# Patient Record
Sex: Male | Born: 1955 | Race: White | Hispanic: No | Marital: Married | Smoking: Never smoker
Health system: Southern US, Community
[De-identification: ages and names within clinical notes are randomized; demographics above are authoritative.]

## PROBLEM LIST (undated history)

## (undated) DIAGNOSIS — J343 Hypertrophy of nasal turbinates: Secondary | ICD-10-CM

## (undated) DIAGNOSIS — Z8249 Family history of ischemic heart disease and other diseases of the circulatory system: Secondary | ICD-10-CM

## (undated) DIAGNOSIS — I4891 Unspecified atrial fibrillation: Secondary | ICD-10-CM

## (undated) DIAGNOSIS — K219 Gastro-esophageal reflux disease without esophagitis: Secondary | ICD-10-CM

## (undated) DIAGNOSIS — T7840XA Allergy, unspecified, initial encounter: Secondary | ICD-10-CM

## (undated) DIAGNOSIS — I10 Essential (primary) hypertension: Secondary | ICD-10-CM

## (undated) HISTORY — DX: Unspecified atrial fibrillation: I48.91

## (undated) HISTORY — PX: COLONOSCOPY: SHX174

## (undated) HISTORY — DX: Allergy, unspecified, initial encounter: T78.40XA

## (undated) HISTORY — PX: OTHER SURGICAL HISTORY: SHX169

---

## 1977-07-17 HISTORY — PX: CYST EXCISION: SHX5701

## 1978-07-17 HISTORY — PX: ANKLE ARTHRODESIS: SUR49

## 1998-08-27 ENCOUNTER — Ambulatory Visit (HOSPITAL_BASED_OUTPATIENT_CLINIC_OR_DEPARTMENT_OTHER): Admission: RE | Admit: 1998-08-27 | Discharge: 1998-08-27 | Payer: Self-pay | Admitting: Urology

## 2006-02-12 ENCOUNTER — Encounter: Admission: RE | Admit: 2006-02-12 | Discharge: 2006-02-12 | Payer: Self-pay | Admitting: *Deleted

## 2006-08-21 ENCOUNTER — Ambulatory Visit: Payer: Self-pay | Admitting: Internal Medicine

## 2006-09-28 ENCOUNTER — Ambulatory Visit: Payer: Self-pay | Admitting: Internal Medicine

## 2011-02-06 ENCOUNTER — Ambulatory Visit
Admission: RE | Admit: 2011-02-06 | Discharge: 2011-02-06 | Disposition: A | Discharge status: Home / Self Care | Payer: BC Managed Care – PPO | Source: Ambulatory Visit | Attending: Family Medicine | Admitting: Family Medicine

## 2011-02-06 ENCOUNTER — Other Ambulatory Visit: Payer: Self-pay | Admitting: Family Medicine

## 2011-02-06 DIAGNOSIS — R52 Pain, unspecified: Secondary | ICD-10-CM

## 2011-02-16 ENCOUNTER — Ambulatory Visit
Admission: RE | Admit: 2011-02-16 | Discharge: 2011-02-16 | Disposition: A | Discharge status: Home / Self Care | Payer: BC Managed Care – PPO | Source: Ambulatory Visit | Attending: Unknown Physician Specialty | Admitting: Unknown Physician Specialty

## 2011-02-16 ENCOUNTER — Other Ambulatory Visit: Payer: Self-pay | Admitting: Unknown Physician Specialty

## 2011-02-16 DIAGNOSIS — S93409A Sprain of unspecified ligament of unspecified ankle, initial encounter: Secondary | ICD-10-CM

## 2012-06-01 IMAGING — CR DG ANKLE COMPLETE 3+V*R*
2 series · 2 of 2 positions shown · non-contrast
Comparison: Right foot radiographs 02/06/2011.

CLINICAL DATA: Diffuse ankle pain and swelling following twisting
injury.

RIGHT ANKLE - COMPLETE 3+ VIEW

[view not recorded (1 of 2)]
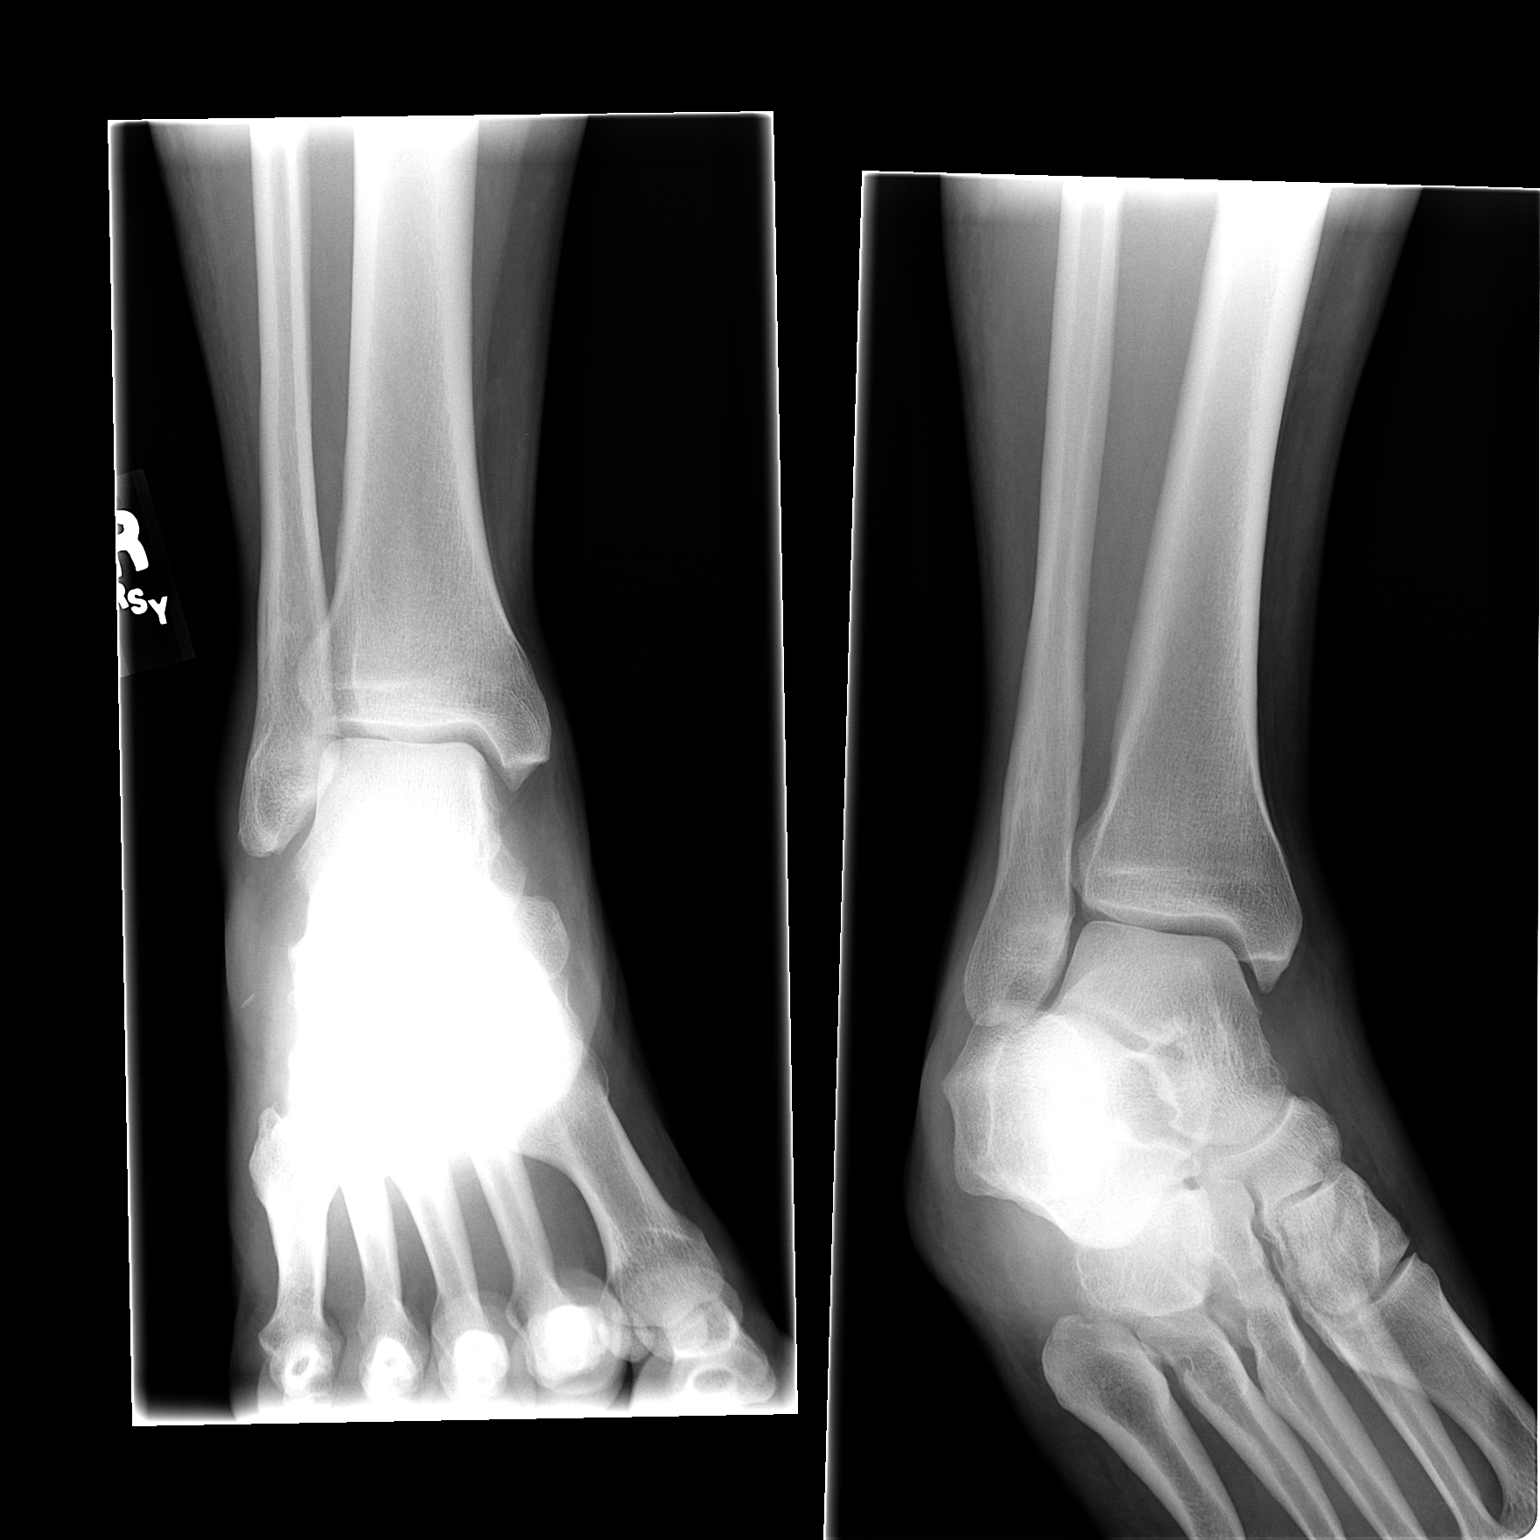

[view not recorded (2 of 2)]
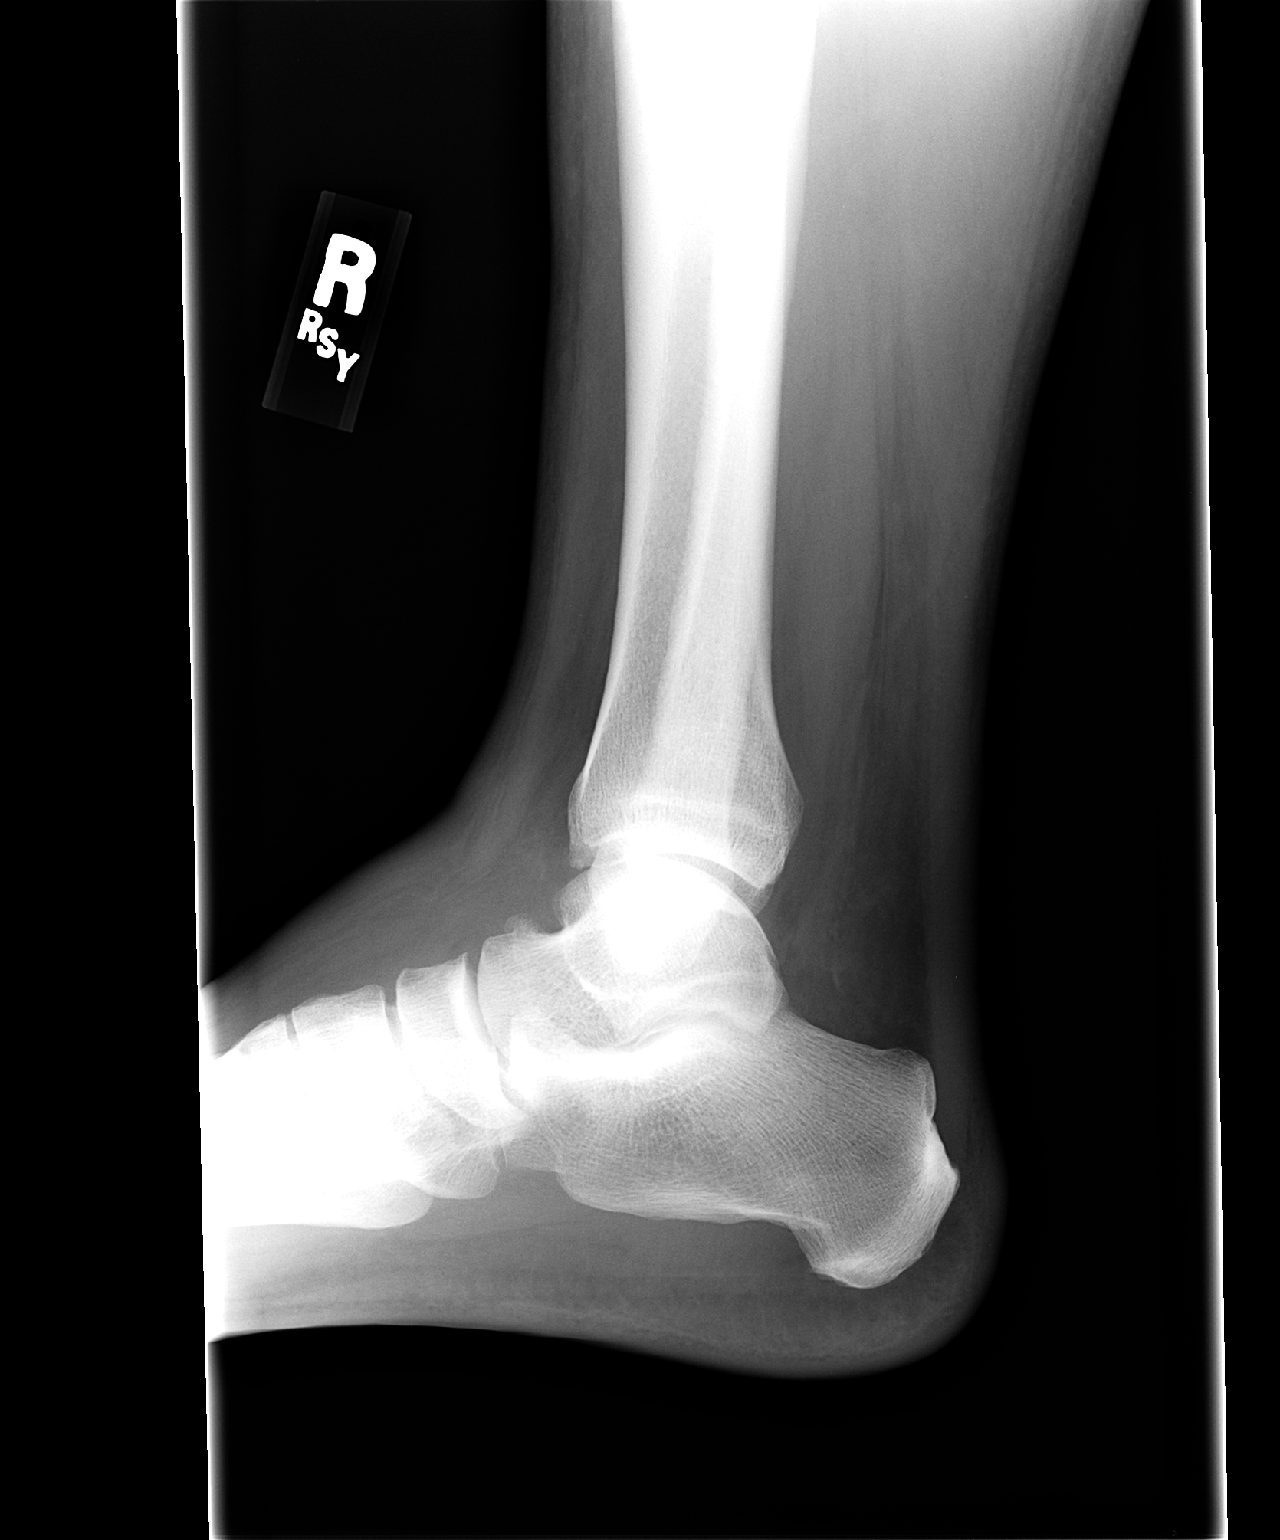

[2 of 2 positions shown; findings below may reference images not displayed]

FINDINGS: There is a small ossific density lateral to the mid foot
which could reflect a small avulsion fracture.  This is not clearly
seen on the prior examination.  There appears to be mild soft
tissue swelling in this area and extending over the dorsum of the
foot.  There is no other evidence of acute fracture or dislocation.
The alignment is normal at the ankle.
IMPRESSION: Possible small avulsion fracture laterally from the midfoot with
associated increased soft tissue swelling.

## 2013-12-30 ENCOUNTER — Other Ambulatory Visit: Payer: Self-pay | Admitting: Orthopedic Surgery

## 2013-12-31 ENCOUNTER — Encounter (HOSPITAL_BASED_OUTPATIENT_CLINIC_OR_DEPARTMENT_OTHER): Payer: Self-pay | Admitting: *Deleted

## 2013-12-31 NOTE — Progress Notes (Signed)
May r/s-no labs needed

## 2014-01-06 ENCOUNTER — Ambulatory Visit (HOSPITAL_BASED_OUTPATIENT_CLINIC_OR_DEPARTMENT_OTHER)
Admission: RE | Admit: 2014-01-06 | Payer: BC Managed Care – PPO | Source: Ambulatory Visit | Admitting: Orthopedic Surgery

## 2014-01-06 HISTORY — DX: Gastro-esophageal reflux disease without esophagitis: K21.9

## 2014-01-06 SURGERY — OPEN REDUCTION INTERNAL FIXATION (ORIF) ELBOW/OLECRANON FRACTURE
Anesthesia: General | Laterality: Left

## 2016-08-11 ENCOUNTER — Ambulatory Visit
Admission: RE | Admit: 2016-08-11 | Discharge: 2016-08-11 | Disposition: A | Discharge status: Home / Self Care | Payer: BLUE CROSS/BLUE SHIELD | Source: Ambulatory Visit | Attending: Family Medicine | Admitting: Family Medicine

## 2016-08-11 ENCOUNTER — Other Ambulatory Visit: Payer: Self-pay | Admitting: Family Medicine

## 2016-08-11 DIAGNOSIS — M25512 Pain in left shoulder: Secondary | ICD-10-CM

## 2016-08-11 DIAGNOSIS — M25511 Pain in right shoulder: Secondary | ICD-10-CM

## 2016-09-12 ENCOUNTER — Encounter: Payer: Self-pay | Admitting: Internal Medicine

## 2017-02-06 ENCOUNTER — Telehealth: Payer: Self-pay | Admitting: Internal Medicine

## 2017-02-06 NOTE — Telephone Encounter (Signed)
Left message on machine to call back  

## 2017-02-07 NOTE — Telephone Encounter (Signed)
Patient is not having any upper GI issues.  He is advised he will need an OV to discuss EGD.  He would like to proceed with colonoscopy. He will come for a pre-visit tomorrow and procedure on 02/12/17

## 2017-02-08 ENCOUNTER — Ambulatory Visit (AMBULATORY_SURGERY_CENTER): Payer: Self-pay

## 2017-02-08 VITALS — Ht 74.0 in | Wt 247.8 lb

## 2017-02-08 DIAGNOSIS — Z1211 Encounter for screening for malignant neoplasm of colon: Secondary | ICD-10-CM

## 2017-02-08 MED ORDER — NA SULFATE-K SULFATE-MG SULF 17.5-3.13-1.6 GM/177ML PO SOLN: 1.0000 | Freq: Once | ORAL | 0 refills | Status: AC

## 2017-02-08 NOTE — Progress Notes (Signed)
Denies allergies to eggs or soy products. Denies complication of anesthesia or sedation. Denies use of weight loss medication. Denies use of O2.   Emmi instructions declined.  

## 2017-02-12 ENCOUNTER — Ambulatory Visit (AMBULATORY_SURGERY_CENTER): Payer: BLUE CROSS/BLUE SHIELD | Admitting: Internal Medicine

## 2017-02-12 ENCOUNTER — Encounter: Payer: Self-pay | Admitting: Internal Medicine

## 2017-02-12 VITALS — BP 139/90 | HR 66 | Temp 96.6°F | Resp 14 | Ht 74.0 in | Wt 247.0 lb

## 2017-02-12 DIAGNOSIS — Z1212 Encounter for screening for malignant neoplasm of rectum: Secondary | ICD-10-CM | POA: Diagnosis not present

## 2017-02-12 DIAGNOSIS — D123 Benign neoplasm of transverse colon: Secondary | ICD-10-CM

## 2017-02-12 DIAGNOSIS — Z1211 Encounter for screening for malignant neoplasm of colon: Secondary | ICD-10-CM | POA: Diagnosis not present

## 2017-02-12 MED ORDER — SODIUM CHLORIDE 0.9 % IV SOLN: 500.0000 mL | INTRAVENOUS | Status: AC

## 2017-02-12 NOTE — Progress Notes (Signed)
To PACU VSS. Report to RN. Tb

## 2017-02-12 NOTE — Op Note (Signed)
[place] Patient Name: Calvin [name] Procedure Date: 02/12/2017 3:21 PM MRN: [id number] Endoscopist: Docia Chuck. Henrene Pastor , MD Age: 61 Referring MD:  Date of Birth: [birth year] Gender: Male Account #: [id number] Procedure:                Colonoscopy, with cold snare polypectomy Indications:              Screening for colorectal malignant neoplasm.                            Previous examination 2008 (to evaluate iron                            deficiency anemia) was negative for neoplasia. Medicines:                Monitored Anesthesia Care Procedure:                Pre-Anesthesia Assessment:                           - Prior to the procedure, a History and Physical                            was performed, and patient medications and                            allergies were reviewed. The patient's tolerance of                            previous anesthesia was also reviewed. The risks                            and benefits of the procedure and the sedation                            options and risks were discussed with the patient.                            All questions were answered, and informed consent                            was obtained. Prior Anticoagulants: The patient has                            taken no previous anticoagulant or antiplatelet                            agents. ASA Grade Assessment: II - A patient with                            mild systemic disease. After reviewing the risks                            and benefits, the patient was deemed in  satisfactory condition to undergo the procedure.                           After obtaining informed consent, the colonoscope                            was passed under direct vision. Throughout the                            procedure, the patient's blood pressure, pulse, and                            oxygen saturations were monitored continuously. The     Colonoscope was introduced through the anus and                            advanced to the the cecum, identified by                            appendiceal orifice and ileocecal valve. The                            ileocecal valve, appendiceal orifice, and rectum                            were photographed. The quality of the bowel                            preparation was excellent. The colonoscopy was                            performed without difficulty. The patient tolerated                            the procedure well. The bowel preparation used was                            SUPREP. Scope In: 3:27:55 PM Scope Out: 3:41:17 PM Scope Withdrawal Time: 0 hours 11 minutes 19 seconds  Total Procedure Duration: 0 hours 13 minutes 22 seconds  Findings:                 A 4 mm polyp was found in the transverse colon. The                            polyp was removed with a cold snare. Resection and                            retrieval were complete.                           Multiple diverticula were found in the left colon                            and right colon.  A diffuse area of moderate melanosis was found in                            the entire colon.                           Internal hemorrhoids were found during retroflexion.                           The exam was otherwise without abnormality on                            direct and retroflexion views. Complications:            No immediate complications. Estimated blood loss:                            None. Estimated Blood Loss:     Estimated blood loss: none. Impression:               - One 4 mm polyp in the transverse colon, removed                            with a cold snare. Resected and retrieved.                           - Diverticulosis in the left colon and in the right                            colon.                           - Melanosis in the colon.                           -  Internal hemorrhoids.                           - The examination was otherwise normal on direct                            and retroflexion views. Recommendation:           - Repeat colonoscopy in 5-10 years for                            surveillance, pending pathology.                           - Patient has a contact number available for                            emergencies. The signs and symptoms of potential                            delayed complications were discussed with the  patient. Return to normal activities tomorrow.                            Written discharge instructions were provided to the                            patient.                           - Resume previous diet.                           - Continue present medications.                           - Await pathology results. Docia Chuck. Henrene Pastor, MD 02/12/2017 3:46:07 PM This report has been signed electronically.

## 2017-02-12 NOTE — Progress Notes (Signed)
Called to room to assist during endoscopic procedure.  Patient ID and intended procedure confirmed with present staff. Received instructions for my participation in the procedure from the performing physician.  

## 2017-02-12 NOTE — Patient Instructions (Signed)
Handouts: Polyps, Diverticulosis, Hemorrhoids    YOU HAD AN ENDOSCOPIC PROCEDURE TODAY AT [facility]:   Refer to the procedure report that was given to you for any specific questions about what was found during the examination.  If the procedure report does not answer your questions, please call your gastroenterologist to clarify.  If you requested that your care partner not be given the details of your procedure findings, then the procedure report has been included in a sealed envelope for you to review at your convenience later.  YOU SHOULD EXPECT: Some feelings of bloating in the abdomen. Passage of more gas than usual.  Walking can help get rid of the air that was put into your GI tract during the procedure and reduce the bloating. If you had a lower endoscopy (such as a colonoscopy or flexible sigmoidoscopy) you may notice spotting of blood in your stool or on the toilet paper. If you underwent a bowel prep for your procedure, you may not have a normal bowel movement for a few days.  Please Note:  You might notice some irritation and congestion in your nose or some drainage.  This is from the oxygen used during your procedure.  There is no need for concern and it should clear up in a day or so.  SYMPTOMS TO REPORT IMMEDIATELY:   Following lower endoscopy (colonoscopy or flexible sigmoidoscopy):  Excessive amounts of blood in the stool  Significant tenderness or worsening of abdominal pains  Swelling of the abdomen that is new, acute  Fever of 100F or higher  For urgent or emergent issues, a gastroenterologist can be reached at any hour by calling [phone].   DIET:  We do recommend a small meal at first, but then you may proceed to your regular diet.  Drink plenty of fluids but you should avoid alcoholic beverages for 24 hours.  ACTIVITY:  You should plan to take it easy for the rest of today and you should NOT DRIVE or use heavy machinery until tomorrow (because  of the sedation medicines used during the test).    FOLLOW UP: Our staff will call the number listed on your records the next business day following your procedure to check on you and address any questions or concerns that you may have regarding the information given to you following your procedure. If we do not reach you, we will leave a message.  However, if you are feeling well and you are not experiencing any problems, there is no need to return our call.  We will assume that you have returned to your regular daily activities without incident.  If any biopsies were taken you will be contacted by phone or by letter within the next 1-3 weeks.  Please call us at [phone] if you have not heard about the biopsies in 3 weeks.    SIGNATURES/CONFIDENTIALITY: You and/or your care partner have signed paperwork which will be entered into your electronic medical record.  These signatures attest to the fact that that the information above on your After Visit Summary has been reviewed and is understood.  Full responsibility of the confidentiality of this discharge information lies with you and/or your care-partner.

## 2017-02-13 ENCOUNTER — Telehealth: Payer: Self-pay | Admitting: *Deleted

## 2017-02-13 NOTE — Telephone Encounter (Signed)
  Follow up Call-  Call back number 02/12/2017  Post procedure Call Back phone  # [phone]  Permission to leave phone message Yes  Some recent data might be hidden     Patient questions:  Do you have a fever, pain , or abdominal swelling? No. Pain Score  0 *  Have you tolerated food without any problems? Yes.    Have you been able to return to your normal activities? Yes.    Do you have any questions about your discharge instructions: Diet   No. Medications  No. Follow up visit  No.  Do you have questions or concerns about your Care? No.  Actions: * If pain score is 4 or above: No action needed, pain <4.

## 2017-02-15 ENCOUNTER — Encounter: Payer: Self-pay | Admitting: Internal Medicine

## 2019-07-30 ENCOUNTER — Other Ambulatory Visit: Payer: Self-pay | Admitting: Family Medicine

## 2019-07-30 DIAGNOSIS — R9431 Abnormal electrocardiogram [ECG] [EKG]: Secondary | ICD-10-CM

## 2019-07-30 NOTE — Progress Notes (Addendum)
Pt w/ fmhx of acute cardiovascular ischemic episodes. No sx. Last ECG 2012 nml. Awaiting repeat at pt convenience. Pt would benefit from Echo and one time treadmill stress test.  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    Linna Darner, MD Family Medicine 07/30/2019, 2:30 PM

## 2019-08-12 ENCOUNTER — Other Ambulatory Visit: Payer: Self-pay

## 2019-08-12 ENCOUNTER — Encounter (HOSPITAL_BASED_OUTPATIENT_CLINIC_OR_DEPARTMENT_OTHER): Payer: Self-pay | Admitting: Otolaryngology

## 2019-08-12 ENCOUNTER — Ambulatory Visit (HOSPITAL_COMMUNITY)
Admission: RE | Admit: 2019-08-12 | Discharge: 2019-08-12 | Disposition: A | Discharge status: Home / Self Care | Payer: BC Managed Care – PPO | Source: Ambulatory Visit | Attending: Family Medicine | Admitting: Family Medicine

## 2019-08-12 DIAGNOSIS — R9431 Abnormal electrocardiogram [ECG] [EKG]: Secondary | ICD-10-CM | POA: Diagnosis not present

## 2019-08-12 NOTE — Progress Notes (Signed)
  Echocardiogram 2D Echocardiogram has been performed.  Calvin [name] 08/12/2019, 8:40 AM

## 2019-08-13 ENCOUNTER — Encounter (HOSPITAL_BASED_OUTPATIENT_CLINIC_OR_DEPARTMENT_OTHER)
Admission: RE | Admit: 2019-08-13 | Discharge: 2019-08-13 | Disposition: A | Discharge status: Home / Self Care | Payer: BC Managed Care – PPO | Source: Ambulatory Visit | Attending: Otolaryngology | Admitting: Otolaryngology

## 2019-08-13 DIAGNOSIS — Z20822 Contact with and (suspected) exposure to covid-19: Secondary | ICD-10-CM | POA: Diagnosis not present

## 2019-08-13 DIAGNOSIS — Z01812 Encounter for preprocedural laboratory examination: Secondary | ICD-10-CM | POA: Diagnosis present

## 2019-08-13 DIAGNOSIS — Z01818 Encounter for other preprocedural examination: Secondary | ICD-10-CM | POA: Diagnosis present

## 2019-08-13 LAB — BASIC METABOLIC PANEL
Anion gap: 10 (ref 5–15)
BUN: 19 mg/dL (ref 8–23)
CO2: 28 mmol/L (ref 22–32)
Calcium: 9.3 mg/dL (ref 8.9–10.3)
Chloride: 101 mmol/L (ref 98–111)
Creatinine, Ser: 1.31 mg/dL — ABNORMAL HIGH (ref 0.61–1.24)
GFR calc Af Amer: >60 mL/min (ref >60)
GFR calc non Af Amer: 58 mL/min — ABNORMAL LOW (ref >60)
Glucose, Bld: 94 mg/dL (ref 70–99)
Potassium: 5.2 mmol/L — ABNORMAL HIGH (ref 3.5–5.1)
Sodium: 139 mmol/L (ref 135–145)

## 2019-08-13 NOTE — Progress Notes (Signed)
EKG done and printed. Please see printed EKG.

## 2019-08-14 ENCOUNTER — Other Ambulatory Visit (HOSPITAL_COMMUNITY)
Admission: RE | Admit: 2019-08-14 | Discharge: 2019-08-14 | Disposition: A | Discharge status: Home / Self Care | Payer: BC Managed Care – PPO | Source: Ambulatory Visit | Attending: Otolaryngology | Admitting: Otolaryngology

## 2019-08-14 DIAGNOSIS — J343 Hypertrophy of nasal turbinates: Secondary | ICD-10-CM | POA: Insufficient documentation

## 2019-08-14 DIAGNOSIS — Z01812 Encounter for preprocedural laboratory examination: Secondary | ICD-10-CM | POA: Diagnosis not present

## 2019-08-14 DIAGNOSIS — G4730 Sleep apnea, unspecified: Secondary | ICD-10-CM | POA: Insufficient documentation

## 2019-08-14 DIAGNOSIS — Z5309 Procedure and treatment not carried out because of other contraindication: Secondary | ICD-10-CM | POA: Insufficient documentation

## 2019-08-14 DIAGNOSIS — Z20822 Contact with and (suspected) exposure to covid-19: Secondary | ICD-10-CM | POA: Insufficient documentation

## 2019-08-14 LAB — SARS CORONAVIRUS 2 (TAT 6-24 HRS): SARS Coronavirus 2: NEGATIVE

## 2019-08-14 NOTE — H&P (Signed)
Otolaryngology Clinic Note  HPI:    Calvin [name] is a 64 y.o. male patient of Boris Sharper, MD for preop evaluation for septoplasty and SMR inferior turbinates.  I discussed the surgery in detail including risks and complications.  Questions were answered and informed consent was obtained.  PMH/Meds/All/SocHx/FamHx/ROS:   Past Medical History      Past Medical History:  Diagnosis Date  . Allergy   . Hypertension       Past Surgical History  No past surgical history on file.    No family history of bleeding disorders, wound healing problems or difficulty with anesthesia.   Social History  Social History        Socioeconomic History  . Marital status: Unknown    Spouse name: Not on file  . Number of children: Not on file  . Years of education: Not on file  . Highest education level: Not on file  Occupational History  . Not on file  Social Needs  . Financial resource strain: Not on file  . Food insecurity    Worry: Not on file    Inability: Not on file  . Transportation needs    Medical: Not on file    Non-medical: Not on file  Tobacco Use  . Smoking status: Never Smoker  . Smokeless tobacco: Never Used  Substance and Sexual Activity  . Alcohol use: Yes  . Drug use: Never  . Sexual activity: Not on file  Lifestyle  . Physical activity    Days per week: Not on file    Minutes per session: Not on file  . Stress: Not on file  Relationships  . Social Herbalist on phone: Not on file    Gets together: Not on file    Attends religious service: Not on file    Active member of club or organization: Not on file    Attends meetings of clubs or organizations: Not on file    Relationship status: Not on file  Other Topics Concern  . Not on file  Social History Narrative  . Not on file       Current Outpatient Medications:  .  hydroCHLOROthiazide (HYDRODIURIL) 25 MG tablet, Take 25 mg by mouth daily.,  Disp: , Rfl:  .  lisinopriL (PRINIVIL,ZESTRIL) 20 MG tablet, Take 20 mg by mouth daily., Disp: , Rfl:  .  omeprazole (PRILOSEC) 20 MG capsule, Take 20 mg by mouth daily., Disp: , Rfl:   A complete ROS was performed with pertinent positives/negatives noted in the HPI. The remainder of the ROS are negative.    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 Lungs: Clear to auscultation Heart: Regular rate and rhythm without murmurs Abdomen: Soft, active Extremities: Normal configuration Neurologic: Symmetric, grossly intact.       Impression & Plans:   Satisfactory preop check.  Plan: We will proceed with septoplasty and reduction of turbinates.  We will observe him overnight 23 hours given sleep apnea.  I gave him nasal hygiene instructions.  Code own.  I will remove nasal packs the morning following surgery, and then see him back here 9 days later for removal of septal splints.   Lilyan Gilford, MD  08/11/2019   

## 2019-08-15 NOTE — Progress Notes (Signed)
Called Dr [facility] office and spoke to his scheduler Angie. Chart was reviewed by Dr Suann Larry and states that pt will need to have a cardiology appt and a stress test that was recommended by his PCP but not ordered, before his surgery can be done. Angie states that she will let Dr Erik Obey aware.

## 2019-08-18 ENCOUNTER — Other Ambulatory Visit: Payer: Self-pay | Admitting: Family Medicine

## 2019-08-18 ENCOUNTER — Ambulatory Visit (HOSPITAL_BASED_OUTPATIENT_CLINIC_OR_DEPARTMENT_OTHER): Admission: RE | Admit: 2019-08-18 | Payer: BC Managed Care – PPO | Source: Home / Self Care | Admitting: Otolaryngology

## 2019-08-18 DIAGNOSIS — R9431 Abnormal electrocardiogram [ECG] [EKG]: Secondary | ICD-10-CM

## 2019-08-18 HISTORY — DX: Hypertrophy of nasal turbinates: J34.3

## 2019-08-18 HISTORY — DX: Essential (primary) hypertension: I10

## 2019-08-18 HISTORY — DX: Family history of ischemic heart disease and other diseases of the circulatory system: Z82.49

## 2019-08-18 SURGERY — SEPTOPLASTY, NOSE, WITH NASAL TURBINATE REDUCTION
Anesthesia: General | Laterality: Bilateral

## 2019-08-18 NOTE — Progress Notes (Signed)
Orders changed to facilitate testing for surgery clearance.  Linna Darner, MD Family Medicine 08/18/2019, 1:45 PM

## 2019-08-19 ENCOUNTER — Telehealth (HOSPITAL_COMMUNITY): Payer: Self-pay

## 2019-08-19 NOTE — Telephone Encounter (Signed)
Encounter complete. 

## 2019-08-20 ENCOUNTER — Ambulatory Visit (HOSPITAL_COMMUNITY)
Admission: RE | Admit: 2019-08-20 | Discharge: 2019-08-20 | Disposition: A | Discharge status: Home / Self Care | Payer: BC Managed Care – PPO | Source: Ambulatory Visit | Attending: Cardiology | Admitting: Cardiology

## 2019-08-20 ENCOUNTER — Other Ambulatory Visit: Payer: Self-pay

## 2019-08-20 DIAGNOSIS — R9431 Abnormal electrocardiogram [ECG] [EKG]: Secondary | ICD-10-CM

## 2019-08-20 LAB — EXERCISE TOLERANCE TEST
Estimated workload: 13.2 METS
Exercise duration (min): 10 min
Exercise duration (sec): 54 s
MPHR: 158 {beats}/min
Peak HR: 169 {beats}/min
Percent HR: 106 %
RPE: 17
Rest HR: 87 {beats}/min

## 2019-08-21 ENCOUNTER — Telehealth: Payer: Self-pay | Admitting: Family Medicine

## 2019-08-21 NOTE — H&P (Signed)
Otolaryngology Clinic Note  HPI:    Calvin [name] is a 64 y.o. male patient of Boris Sharper, MD for preop evaluation for septoplasty and SMR inferior turbinates.  I discussed the surgery in detail including risks and complications.  Questions were answered and informed consent was obtained.  PMH/Meds/All/SocHx/FamHx/ROS:   Past Medical History      Past Medical History:  Diagnosis Date  . Allergy   . Hypertension       Past Surgical History  No past surgical history on file.    No family history of bleeding disorders, wound healing problems or difficulty with anesthesia.   Social History  Social History        Socioeconomic History  . Marital status: Unknown    Spouse name: Not on file  . Number of children: Not on file  . Years of education: Not on file  . Highest education level: Not on file  Occupational History  . Not on file  Social Needs  . Financial resource strain: Not on file  . Food insecurity    Worry: Not on file    Inability: Not on file  . Transportation needs    Medical: Not on file    Non-medical: Not on file  Tobacco Use  . Smoking status: Never Smoker  . Smokeless tobacco: Never Used  Substance and Sexual Activity  . Alcohol use: Yes  . Drug use: Never  . Sexual activity: Not on file  Lifestyle  . Physical activity    Days per week: Not on file    Minutes per session: Not on file  . Stress: Not on file  Relationships  . Social Herbalist on phone: Not on file    Gets together: Not on file    Attends religious service: Not on file    Active member of club or organization: Not on file    Attends meetings of clubs or organizations: Not on file    Relationship status: Not on file  Other Topics Concern  . Not on file  Social History Narrative  . Not on file       Current Outpatient Medications:  .  hydroCHLOROthiazide (HYDRODIURIL) 25 MG tablet, Take 25 mg by mouth daily.,  Disp: , Rfl:  .  lisinopriL (PRINIVIL,ZESTRIL) 20 MG tablet, Take 20 mg by mouth daily., Disp: , Rfl:  .  omeprazole (PRILOSEC) 20 MG capsule, Take 20 mg by mouth daily., Disp: , Rfl:   A complete ROS was performed with pertinent positives/negatives noted in the HPI. The remainder of the ROS are negative.    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 Lungs: Clear to auscultation Heart: Regular rate and rhythm without murmurs Abdomen: Soft, active Extremities: Normal configuration Neurologic: Symmetric, grossly intact.       Impression & Plans:   Satisfactory preop check.  Plan: We will proceed with septoplasty and reduction of turbinates.  We will observe him overnight 23 hours given sleep apnea.  I gave him nasal hygiene instructions.  Code own.  I will remove nasal packs the morning following surgery, and then see him back here 9 days later for removal of septal splints.   Lilyan Gilford, MD  08/11/2019  

## 2019-08-21 NOTE — Anesthesia Preprocedure Evaluation (Addendum)
Anesthesia Evaluation  Patient identified by MRN, date of birth, ID band Patient awake    Reviewed: Allergy & Precautions, NPO status , Patient's Chart, lab work & pertinent test results  History of Anesthesia Complications Negative for: history of anesthetic complications  Airway Mallampati: II  TM Distance: >3 FB Neck ROM: Full    Dental no notable dental hx.    Pulmonary neg pulmonary ROS,    Pulmonary exam normal        Cardiovascular hypertension, Pt. on medications Normal cardiovascular exam     Neuro/Psych negative neurological ROS  negative psych ROS   GI/Hepatic Neg liver ROS, GERD  Medicated and Controlled,  Endo/Other  negative endocrine ROS  Renal/GU negative Renal ROS  negative genitourinary   Musculoskeletal negative musculoskeletal ROS (+)   Abdominal   Peds  Hematology negative hematology ROS (+)   Anesthesia Other Findings Day of surgery medications reviewed with patient.  Reproductive/Obstetrics negative OB ROS                            Anesthesia Physical Anesthesia Plan  ASA: II  Anesthesia Plan: General   Post-op Pain Management:    Induction: Intravenous  PONV Risk Score and Plan: 3 and Treatment may vary due to age or medical condition, Ondansetron, Dexamethasone and Midazolam  Airway Management Planned: Oral ETT  Additional Equipment: None  Intra-op Plan:   Post-operative Plan: Extubation in OR  Informed Consent: I have reviewed the patients History and Physical, chart, labs and discussed the procedure including the risks, benefits and alternatives for the proposed anesthesia with the patient or authorized representative who has indicated his/her understanding and acceptance.     Dental advisory given  Plan Discussed with: CRNA  Anesthesia Plan Comments: (Exercise Stress test 08/20/19 [place] physician: Donato Heinz, MD  Excellent  exercise capacity (13.2 METS) No evidence of ischemia  ECHO 1/21 Left Ventricle: Left ventricular ejection fraction, by visual estimation, is 60 to 65%. The left ventricle has normal function. The left ventricle has no regional wall motion abnormalities. There is no left ventricular hypertrophy. Left ventricular diastolic parameters were normal. )       Anesthesia Quick Evaluation

## 2019-08-21 NOTE — Progress Notes (Signed)
Spoke with Lattie Haw at Dr. Noreene Filbert office earlier today. Informed her that anesthesiologist,  Dr. Blair Promise reviewed patient's ETT and echo results and that he is requiring cardiology interpretation of patient's ETT before he will provide approval for anesthesia . Also informed her that patient would need a new Covid test, and would need to go today to be swabbed. Lattie Haw stated patient was aware of need for new test. Provided her with the test site closing time.

## 2019-08-21 NOTE — Telephone Encounter (Signed)
Treadmill Stress test reviewed w/ pt.  Nml stress test. No evidence of ischemia.  Pt is cleared from a cardiovascular standpoint for surgery.    Linna Darner, MD Family Medicine 08/21/2019, 1:27 PM

## 2019-08-21 NOTE — Progress Notes (Signed)
Patient's ETT interpretation reviewed by Dr. Ambrose Pancoast and deemed OK for surgery. Called Lisa at Dr. Noreene Filbert office to inform them that patient is to be at [facility] for rapid covid test tomorrow morning at 8a, and to be NPO after MN tonight, to take his omeprazole with a sip of water, and use his flonase tomorrow morning. He is to arrive at [facility] at 11:00am tomorrow and wait in the car until he is contacted to come inside. Lattie Haw expressed understanding of same and is going to communicate information to patient.

## 2019-08-22 ENCOUNTER — Encounter (HOSPITAL_BASED_OUTPATIENT_CLINIC_OR_DEPARTMENT_OTHER)
Admission: RE | Disposition: A | Discharge status: Home / Self Care | Payer: Self-pay | Source: Home / Self Care | Attending: Otolaryngology

## 2019-08-22 ENCOUNTER — Encounter (HOSPITAL_BASED_OUTPATIENT_CLINIC_OR_DEPARTMENT_OTHER): Payer: Self-pay | Admitting: Otolaryngology

## 2019-08-22 ENCOUNTER — Other Ambulatory Visit (HOSPITAL_COMMUNITY): Payer: Self-pay

## 2019-08-22 ENCOUNTER — Ambulatory Visit (HOSPITAL_BASED_OUTPATIENT_CLINIC_OR_DEPARTMENT_OTHER)
Admission: RE | Admit: 2019-08-22 | Discharge: 2019-08-22 | Disposition: A | Discharge status: Home / Self Care | Payer: BC Managed Care – PPO | Attending: Otolaryngology | Admitting: Otolaryngology

## 2019-08-22 ENCOUNTER — Other Ambulatory Visit: Payer: Self-pay

## 2019-08-22 ENCOUNTER — Other Ambulatory Visit (HOSPITAL_COMMUNITY)
Admission: RE | Admit: 2019-08-22 | Discharge: 2019-08-22 | Disposition: A | Discharge status: Home / Self Care | Payer: BC Managed Care – PPO | Source: Ambulatory Visit | Attending: Otolaryngology | Admitting: Otolaryngology

## 2019-08-22 ENCOUNTER — Ambulatory Visit (HOSPITAL_BASED_OUTPATIENT_CLINIC_OR_DEPARTMENT_OTHER): Payer: BC Managed Care – PPO | Admitting: Anesthesiology

## 2019-08-22 DIAGNOSIS — G4730 Sleep apnea, unspecified: Secondary | ICD-10-CM | POA: Insufficient documentation

## 2019-08-22 DIAGNOSIS — I10 Essential (primary) hypertension: Secondary | ICD-10-CM | POA: Diagnosis not present

## 2019-08-22 DIAGNOSIS — J343 Hypertrophy of nasal turbinates: Secondary | ICD-10-CM | POA: Insufficient documentation

## 2019-08-22 DIAGNOSIS — K219 Gastro-esophageal reflux disease without esophagitis: Secondary | ICD-10-CM | POA: Insufficient documentation

## 2019-08-22 DIAGNOSIS — Z20822 Contact with and (suspected) exposure to covid-19: Secondary | ICD-10-CM | POA: Insufficient documentation

## 2019-08-22 DIAGNOSIS — J342 Deviated nasal septum: Secondary | ICD-10-CM | POA: Diagnosis present

## 2019-08-22 DIAGNOSIS — Z79899 Other long term (current) drug therapy: Secondary | ICD-10-CM | POA: Insufficient documentation

## 2019-08-22 HISTORY — PX: NASAL SEPTOPLASTY W/ TURBINOPLASTY: SHX2070

## 2019-08-22 LAB — RESPIRATORY PANEL BY RT PCR (FLU A&B, COVID)
Influenza A by PCR: NEGATIVE
Influenza B by PCR: NEGATIVE
SARS Coronavirus 2 by RT PCR: NEGATIVE

## 2019-08-22 SURGERY — SEPTOPLASTY, NOSE, WITH NASAL TURBINATE REDUCTION
Anesthesia: General | Site: Nose | Laterality: Bilateral

## 2019-08-22 MED ORDER — OXYMETAZOLINE HCL 0.05 % NA SOLN: 2.0000 | NASAL | Status: DC | PRN

## 2019-08-22 MED ORDER — BACITRACIN ZINC 500 UNIT/GM EX OINT
TOPICAL_OINTMENT | CUTANEOUS | Status: AC
  Filled 2019-08-22: qty 28.35

## 2019-08-22 MED ORDER — MUPIROCIN 2 % EX OINT
TOPICAL_OINTMENT | CUTANEOUS | Status: AC
  Filled 2019-08-22: qty 22

## 2019-08-22 MED ORDER — OXYMETAZOLINE HCL 0.05 % NA SOLN
NASAL | Status: DC | PRN
  Administered 2019-08-22: 2 via TOPICAL

## 2019-08-22 MED ORDER — FENTANYL CITRATE (PF) 100 MCG/2ML IJ SOLN: 50.0000 ug | INTRAMUSCULAR | Status: DC | PRN

## 2019-08-22 MED ORDER — LIDOCAINE-EPINEPHRINE 1 %-1:100000 IJ SOLN
INTRAMUSCULAR | Status: AC
  Filled 2019-08-22: qty 2

## 2019-08-22 MED ORDER — FENTANYL CITRATE (PF) 100 MCG/2ML IJ SOLN
INTRAMUSCULAR | Status: AC
  Filled 2019-08-22: qty 2

## 2019-08-22 MED ORDER — CEFAZOLIN SODIUM-DEXTROSE 2-4 GM/100ML-% IV SOLN: 2.0000 g | Freq: Once | INTRAVENOUS | Status: DC

## 2019-08-22 MED ORDER — MIDAZOLAM HCL 5 MG/5ML IJ SOLN
INTRAMUSCULAR | Status: DC | PRN
  Administered 2019-08-22: 2 mg via INTRAVENOUS

## 2019-08-22 MED ORDER — OXYMETAZOLINE HCL 0.05 % NA SOLN
NASAL | Status: AC
  Filled 2019-08-22: qty 30

## 2019-08-22 MED ORDER — DEXAMETHASONE SODIUM PHOSPHATE 10 MG/ML IJ SOLN
INTRAMUSCULAR | Status: AC
  Filled 2019-08-22: qty 1

## 2019-08-22 MED ORDER — PROPOFOL 10 MG/ML IV BOLUS
INTRAVENOUS | Status: DC | PRN
  Administered 2019-08-22: 200 mg via INTRAVENOUS

## 2019-08-22 MED ORDER — SUGAMMADEX SODIUM 200 MG/2ML IV SOLN
INTRAVENOUS | Status: DC | PRN
  Administered 2019-08-22: 300 mg via INTRAVENOUS

## 2019-08-22 MED ORDER — LIDOCAINE 2% (20 MG/ML) 5 ML SYRINGE
INTRAMUSCULAR | Status: DC | PRN
  Administered 2019-08-22: 100 mg via INTRAVENOUS

## 2019-08-22 MED ORDER — BUPIVACAINE HCL (PF) 0.25 % IJ SOLN
INTRAMUSCULAR | Status: AC
  Filled 2019-08-22: qty 30

## 2019-08-22 MED ORDER — PROPOFOL 10 MG/ML IV BOLUS
INTRAVENOUS | Status: AC
  Filled 2019-08-22: qty 40

## 2019-08-22 MED ORDER — ONDANSETRON HCL 4 MG/2ML IJ SOLN
INTRAMUSCULAR | Status: DC | PRN
  Administered 2019-08-22: 4 mg via INTRAVENOUS

## 2019-08-22 MED ORDER — CEFAZOLIN SODIUM-DEXTROSE 2-4 GM/100ML-% IV SOLN
INTRAVENOUS | Status: AC
  Filled 2019-08-22: qty 100

## 2019-08-22 MED ORDER — LACTATED RINGERS IV SOLN: INTRAVENOUS | Status: DC

## 2019-08-22 MED ORDER — ONDANSETRON HCL 4 MG/2ML IJ SOLN
INTRAMUSCULAR | Status: AC
  Filled 2019-08-22: qty 2

## 2019-08-22 MED ORDER — PROMETHAZINE HCL 25 MG/ML IJ SOLN: 6.2500 mg | INTRAMUSCULAR | Status: DC | PRN

## 2019-08-22 MED ORDER — DEXAMETHASONE SODIUM PHOSPHATE 10 MG/ML IJ SOLN
INTRAMUSCULAR | Status: DC | PRN
  Administered 2019-08-22: 10 mg via INTRAVENOUS

## 2019-08-22 MED ORDER — MIDAZOLAM HCL 2 MG/2ML IJ SOLN
INTRAMUSCULAR | Status: AC
  Filled 2019-08-22: qty 2

## 2019-08-22 MED ORDER — MIDAZOLAM HCL 2 MG/2ML IJ SOLN: 1.0000 mg | INTRAMUSCULAR | Status: DC | PRN

## 2019-08-22 MED ORDER — CEFAZOLIN SODIUM-DEXTROSE 2-4 GM/100ML-% IV SOLN
2.0000 g | Freq: Once | INTRAVENOUS | Status: AC
  Administered 2019-08-22: 12:00:00 2 g via INTRAVENOUS

## 2019-08-22 MED ORDER — ROCURONIUM BROMIDE 10 MG/ML (PF) SYRINGE
PREFILLED_SYRINGE | INTRAVENOUS | Status: DC | PRN
  Administered 2019-08-22: 50 mg via INTRAVENOUS

## 2019-08-22 MED ORDER — BUPIVACAINE HCL (PF) 0.5 % IJ SOLN
INTRAMUSCULAR | Status: AC
  Filled 2019-08-22: qty 30

## 2019-08-22 MED ORDER — LIDOCAINE-EPINEPHRINE 1 %-1:100000 IJ SOLN
INTRAMUSCULAR | Status: DC | PRN
  Administered 2019-08-22: 18 mL

## 2019-08-22 MED ORDER — FENTANYL CITRATE (PF) 250 MCG/5ML IJ SOLN
INTRAMUSCULAR | Status: DC | PRN
  Administered 2019-08-22 (×2): 50 ug via INTRAVENOUS

## 2019-08-22 MED ORDER — FENTANYL CITRATE (PF) 100 MCG/2ML IJ SOLN: 25.0000 ug | INTRAMUSCULAR | Status: DC | PRN

## 2019-08-22 SURGICAL SUPPLY — 46 items
AIRWAY NASO PHAR 26FR 6.5 (TUBING)
AIRWAY NASOPHAR 26 6.5 (TUBING) IMPLANT
ARWY NASO THNWL 26FR 6.5 (TUBING)
ATTRACTOMAT 16X20 MAGNETIC DRP (DRAPES) ×1 IMPLANT
CANISTER SUCT 1200ML W/VALVE (MISCELLANEOUS) ×2 IMPLANT
COAGULATOR SUCT 8FR VV (MISCELLANEOUS) IMPLANT
COVER WAND RF STERILE (DRAPES) IMPLANT
DECANTER SPIKE VIAL GLASS SM (MISCELLANEOUS) IMPLANT
DEPRESSOR TONGUE BLADE STERILE (MISCELLANEOUS) ×4 IMPLANT
DRSG NASOPORE 8CM (GAUZE/BANDAGES/DRESSINGS) IMPLANT
DRSG TELFA 3X8 NADH (GAUZE/BANDAGES/DRESSINGS) ×2 IMPLANT
ELECT REM PT RETURN 9FT ADLT (ELECTROSURGICAL) ×2
ELECTRODE REM PT RTRN 9FT ADLT (ELECTROSURGICAL) ×1 IMPLANT
GLOVE BIOGEL PI IND STRL 7.0 (GLOVE) IMPLANT
GLOVE BIOGEL PI IND STRL 8 (GLOVE) IMPLANT
GLOVE BIOGEL PI INDICATOR 7.0 (GLOVE) ×1
GLOVE BIOGEL PI INDICATOR 8 (GLOVE) ×1
GLOVE ECLIPSE 8.0 STRL XLNG CF (GLOVE) ×4 IMPLANT
GLOVE SS BIOGEL STRL SZ 7 (GLOVE) IMPLANT
GLOVE SUPERSENSE BIOGEL SZ 7 (GLOVE) ×1
GOWN STRL REUS W/ TWL LRG LVL3 (GOWN DISPOSABLE) ×1 IMPLANT
GOWN STRL REUS W/ TWL XL LVL3 (GOWN DISPOSABLE) ×1 IMPLANT
GOWN STRL REUS W/TWL LRG LVL3 (GOWN DISPOSABLE) ×2
GOWN STRL REUS W/TWL XL LVL3 (GOWN DISPOSABLE) ×2
NDL HYPO 25X1 1.5 SAFETY (NEEDLE) ×1 IMPLANT
NDL SPNL 25GX3.5 QUINCKE BL (NEEDLE) ×1 IMPLANT
NEEDLE HYPO 25X1 1.5 SAFETY (NEEDLE) ×2 IMPLANT
NEEDLE SPNL 25GX3.5 QUINCKE BL (NEEDLE) ×2 IMPLANT
NS IRRIG 1000ML POUR BTL (IV SOLUTION) ×1 IMPLANT
PACK BASIN DAY SURGERY FS (CUSTOM PROCEDURE TRAY) ×2 IMPLANT
PACK ENT DAY SURGERY (CUSTOM PROCEDURE TRAY) ×2 IMPLANT
PAD DRESSING TELFA 3X8 NADH (GAUZE/BANDAGES/DRESSINGS) ×1 IMPLANT
PATTIES SURGICAL .5 X3 (DISPOSABLE) ×2 IMPLANT
SHEET SILICONE 2X3 0.04 REINF (MISCELLANEOUS) ×2 IMPLANT
SLEEVE SCD COMPRESS KNEE MED (MISCELLANEOUS) ×2 IMPLANT
SPONGE GAUZE 2X2 8PLY STRL LF (GAUZE/BANDAGES/DRESSINGS) ×2 IMPLANT
SPONGE LAP 4X18 RFD (DISPOSABLE) ×2 IMPLANT
SUT CHROMIC 3 0 PS 2 (SUTURE) IMPLANT
SUT CHROMIC 4 0 P 3 18 (SUTURE) ×2 IMPLANT
SUT ETHILON 3 0 PS 1 (SUTURE) ×2 IMPLANT
SUT PDS AB 4-0 RB1 27 (SUTURE) IMPLANT
SUT PLAIN 4 0 ~~LOC~~ 1 (SUTURE) IMPLANT
SUT VIC AB 3-0 FS2 27 (SUTURE) IMPLANT
TOWEL GREEN STERILE FF (TOWEL DISPOSABLE) ×4 IMPLANT
TRAY DSU PREP LF (CUSTOM PROCEDURE TRAY) ×2 IMPLANT
YANKAUER SUCT BULB TIP NO VENT (SUCTIONS) ×2 IMPLANT

## 2019-08-22 NOTE — Anesthesia Postprocedure Evaluation (Signed)
Anesthesia Post Note  Patient: Calvin [name]  Procedure(s) Performed: NASAL SEPTOPLASTY WITH TURBINATE REDUCTION (Bilateral Nose)     Patient location during evaluation: PACU Anesthesia Type: General Level of consciousness: awake and alert and oriented Pain management: pain level controlled Vital Signs Assessment: post-procedure vital signs reviewed and stable Respiratory status: spontaneous breathing, nonlabored ventilation and respiratory function stable Cardiovascular status: blood pressure returned to baseline Postop Assessment: no apparent nausea or vomiting Anesthetic complications: no    Last Vitals:  Vitals:   08/22/19 1415 08/22/19 1433  BP: (!) 163/92 (!) 166/93  Pulse: 87 86  Resp: 13 18  Temp:  36.7 C  SpO2: 98% 99%    Last Pain:  Vitals:   08/22/19 1433  TempSrc: Oral  PainSc: 3                  Brennan Bailey

## 2019-08-22 NOTE — Interval H&P Note (Signed)
History and Physical Interval Note:  08/22/2019 12:02 PM  Calvin [name]  has presented today for surgery, with the diagnosis of turbinate hypertrophy/deviated septum.  The various methods of treatment have been discussed with the patient and family. After consideration of risks, benefits and other options for treatment, the patient has consented to  Procedure(s): NASAL SEPTOPLASTY WITH TURBINATE REDUCTION (Bilateral) as a surgical intervention.  The patient's history has been re-reviewed, patient re-examined, no change in status, stable for surgery.  I have re-reviewed the patient's chart and labs.  Questions were answered to the patient's satisfaction.     Ileene Hutchinson [facility]

## 2019-08-22 NOTE — Anesthesia Procedure Notes (Signed)
Procedure Name: Intubation Date/Time: 08/22/2019 12:19 PM Performed by: Myna Bright, CRNA Pre-anesthesia Checklist: Patient identified, Emergency Drugs available, Suction available and Patient being monitored Patient Re-evaluated:Patient Re-evaluated prior to induction Oxygen Delivery Method: Circle system utilized Preoxygenation: Pre-oxygenation with 100% oxygen Induction Type: IV induction Ventilation: Mask ventilation without difficulty Laryngoscope Size: Mac and 4 Grade View: Grade I Tube type: Oral Tube size: 8.0 mm Number of attempts: 1 Airway Equipment and Method: Stylet Placement Confirmation: ETT inserted through vocal cords under direct vision,  positive ETCO2 and breath sounds checked- equal and bilateral Secured at: 22 cm Tube secured with: Tape Dental Injury: Teeth and Oropharynx as per pre-operative assessment

## 2019-08-22 NOTE — Discharge Instructions (Signed)
Ice pack x 24 hrs, then as desired Sleep with head elevated 3-4 nights Recheck my office Monday, 8 FEB for pack removal.  Take a dose of pain medication before this visit please.  You are already scheduled for 2:20 for this. Change drip pad as needed. rinse throat with cool dilute salt water to clear old blood and thick phlegm. Begin nasal hygiene measures when nasal packing has been removed. Advance diet as comfortable No strenuous activity x 2 weeks.   Alternate Ibuprofen and Hydrocodone for pain relief Cephalexin antibiotics until gone.    Call for problems or questions.  [phone]   Post Anesthesia Home Care Instructions  Activity: Get plenty of rest for the remainder of the day. A responsible individual must stay with you for 24 hours following the procedure.  For the next 24 hours, DO NOT: -Drive a car -Paediatric nurse -Drink alcoholic beverages -Take any medication unless instructed by your physician -Make any legal decisions or sign important papers.  Meals: Start with liquid foods such as gelatin or soup. Progress to regular foods as tolerated. Avoid greasy, spicy, heavy foods. If nausea and/or vomiting occur, drink only clear liquids until the nausea and/or vomiting subsides. Call your physician if vomiting continues.  Special Instructions/Symptoms: Your throat may feel dry or sore from the anesthesia or the breathing tube placed in your throat during surgery. If this causes discomfort, gargle with warm salt water. The discomfort should disappear within 24 hours.  If you had a scopolamine patch placed behind your ear for the management of post- operative nausea and/or vomiting:  1. The medication in the patch is effective for 72 hours, after which it should be removed.  Wrap patch in a tissue and discard in the trash. Wash hands thoroughly with soap and water. 2. You may remove the patch earlier than 72 hours if you experience unpleasant side effects which may include  dry mouth, dizziness or visual disturbances. 3. Avoid touching the patch. Wash your hands with soap and water after contact with the patch.

## 2019-08-22 NOTE — Transfer of Care (Signed)
Immediate Anesthesia Transfer of Care Note  Patient: Gerlene Fee  Procedure(s) Performed: NASAL SEPTOPLASTY WITH TURBINATE REDUCTION (Bilateral Nose)  Patient Location: PACU  Anesthesia Type:General  Level of Consciousness: drowsy and patient cooperative  Airway & Oxygen Therapy: Patient Spontanous Breathing and Patient connected to face mask oxygen  Post-op Assessment: Report given to RN and Post -op Vital signs reviewed and stable  Post vital signs: Reviewed and stable  Last Vitals:  Vitals Value Taken Time  BP 166/93 08/22/19 1433  Temp 36.7 C 08/22/19 1433  Pulse 86 08/22/19 1433  Resp 18 08/22/19 1433  SpO2 99 % 08/22/19 1433    Last Pain:  Vitals:   08/22/19 1433  TempSrc: Oral  PainSc: 3       Patients Stated Pain Goal: 3 ([id number])  Complications: No apparent anesthesia complications

## 2019-08-22 NOTE — Op Note (Signed)
08/22/2019  2:22 PM    Gerlene Fee  [id number]   Pre-Op Dx:  Deviated Nasal Septum, Hypertrophic Inferior Turbinates  Post-op Dx: Same  Proc: Nasal Septoplasty, Bilateral SMR Inferior Turbinates   Surg:  Jodi Marble T MD  Anes:  GOT  EBL:  min  Comp:  none  Findings:  Leftward septal deviation with maxillary crest spurring.  Bulky inferior turbinates bilateral.  Procedure: With the patient in a comfortable supine position,  general orotracheal anesthesia was induced without difficulty.     The patient received preoperative Afrin spray for topical decongestion and vasoconstriction.  Intravenous prophylactic antibiotics were administered.  At an appropriate level, the patient was placed in a semi-sitting position.  A saline moistened throat pack was placed.  Nasal vibrissae were trimmed.   Afrin  solution was applied on 0.5" x 3" cottonoids to both sides of the septal mucosa.   1% Xylocaine with 1:100,000 epinephrine, 9 cc's, was infiltrated into the anterior floor of the nose, into the nasal spine region, into the membranous columella, and finally into the submucoperichondrial plane of the septum on both sides.  Several minutes were allowed for this to take effect.  A sterile preparation and draping of the midface was accomplished in the standard fashion.  The materials were removed from the nose and observed to be intact and correct in number.  The nose was inspected with a headlight with the findings as described above.  A RIght hemitransfixion incision was sharply executed and carried down to the caudal edge of the quadrangular cartilage and continued to a floor incision.  An opposite small floor incision was sharply executed as well.   Floor tunnels were elevated on both sides, carried posteriorly, then medially, then brought forward along the vomer and maxillary crest.  The submucoperichondrial plane of the  right septum was dissected up to the dorsum of the nose, back  onto the perpendicular plate, and brought down and communicated with a floor tunnel and then forward along the maxillary crest.  There was a linear rent along the maxillary crest inferiorly.   The chondroethmoid junction was identified and opened with a Psychologist, educational.  The opposite submucoperiosteal plane of the perpendicular plate of the ethmoid  was elevated and carried down to the floor tunnel posteriorly.  The superior perpendicular plate was lysed with an open Jansen-Middleton forceps.  The inferior portion was dissected from the maxillary crest and vomer with a Cottle elevator.  The midportion was rocked free with a closed KeySpan forceps and then delivered.    The posterior inferior corner of the quadrangular cartilage was submucosally resected, including a cartilaginous tail up along the vomer.   A thin strip of the inferior caudal strut was removed. The maxillary crest posterior to this was lowered.  After mobilizing the septum adequately, and straightening it in the standard fashion,  The septum was secured to the nasal spine with a figure-of-eight 4-0 PDS suture.  A good straight midline configuration of the septum with good dorsal support was generated.  The septal tunnel was suctioned clear.  Hemostasis was observed.  The flaps were laid back down.  The incisions were closed with interrupted 4-0 chromic suture.  Just prior to completing the septoplasty, the inferior turbinates were each infiltrated with additional 1% Xylocaine with 1:100,000 epinephrine,  6 cc's total.  Upon completing the septoplasty, beginning on the RIGHT side, the inferior turbinate was inspected and infractured.  The anterior hood of the inferior turbinate was sharply  lysed just behind the nasal valve.  The medial mucosa of the inferior turbinate was incised in an  anterior upsloping fashion and a laterally based flap was developed from the turbinate bone.  Using angled turbinate scissors, turbinate bone  and lateral mucosa were resected in a posterior downsloping fashion, taking much of the anterior pole and leaving most of the posterior pole.  Bony spicules were submucosally dissected and removed.  The mucosal flap was laid back down and the turbinate was outfractured.  This completed one SMR inferior turbinate.  The opposite side was performed in identical fashion.  The cut mucosal edges were suction coagulated on both sides for hemostasis.  Again hemostasis was observed.  After completing both turbinate resections, 0.040" reinforced Silastic splints were fashioned, placed against the nasal septum for support, and secured thereto with a 3-0 Ethilon stitch.   Telfa packs impregnated with bacitracin ointment were placed between the septum and the inferior turbinates, one on each side, for hemostasis and support.     At this point the procedure was completed.  The pharynx was suctioned free and the throat pack was removed.   The patient was returned to anesthesia, awakened, extubated, and transferred to recovery in stable condition.  Dispo:   PACU to home  Plan: Ice, elevation, narcotic analgesia, prophylactic antibiotics for the duration of indwelling nasal foreign bodies.  We will remove the nasal packing In one day, the septal splints in 8 days.  Return to work or school in 10 days, strenuous activities in two weeks.  Tyson Alias MD

## 2019-08-26 ENCOUNTER — Encounter: Payer: Self-pay | Admitting: *Deleted

## 2021-08-03 ENCOUNTER — Other Ambulatory Visit: Payer: Self-pay | Admitting: Family Medicine

## 2021-08-03 DIAGNOSIS — I2510 Atherosclerotic heart disease of native coronary artery without angina pectoris: Secondary | ICD-10-CM

## 2021-08-25 ENCOUNTER — Other Ambulatory Visit: Payer: BC Managed Care – PPO

## 2021-09-27 ENCOUNTER — Ambulatory Visit
Admission: RE | Admit: 2021-09-27 | Discharge: 2021-09-27 | Disposition: A | Discharge status: Home / Self Care | Payer: No Typology Code available for payment source | Source: Ambulatory Visit | Attending: Family Medicine | Admitting: Family Medicine

## 2021-09-27 DIAGNOSIS — I2510 Atherosclerotic heart disease of native coronary artery without angina pectoris: Secondary | ICD-10-CM

## 2022-01-19 ENCOUNTER — Inpatient Hospital Stay (HOSPITAL_COMMUNITY): Payer: 59

## 2022-01-19 ENCOUNTER — Other Ambulatory Visit: Payer: Self-pay

## 2022-01-19 ENCOUNTER — Emergency Department (HOSPITAL_COMMUNITY): Payer: 59

## 2022-01-19 ENCOUNTER — Inpatient Hospital Stay (HOSPITAL_COMMUNITY)
Admission: EM | Admit: 2022-01-19 | Discharge: 2022-01-21 | DRG: 309 | Disposition: A | Discharge status: Home / Self Care | Payer: 59 | Attending: Internal Medicine | Admitting: Internal Medicine

## 2022-01-19 ENCOUNTER — Encounter (HOSPITAL_COMMUNITY): Payer: Self-pay | Admitting: *Deleted

## 2022-01-19 DIAGNOSIS — R0602 Shortness of breath: Secondary | ICD-10-CM | POA: Diagnosis present

## 2022-01-19 DIAGNOSIS — Z8249 Family history of ischemic heart disease and other diseases of the circulatory system: Secondary | ICD-10-CM

## 2022-01-19 DIAGNOSIS — Z981 Arthrodesis status: Secondary | ICD-10-CM | POA: Diagnosis not present

## 2022-01-19 DIAGNOSIS — K219 Gastro-esophageal reflux disease without esophagitis: Secondary | ICD-10-CM | POA: Diagnosis present

## 2022-01-19 DIAGNOSIS — A044 Other intestinal Escherichia coli infections: Secondary | ICD-10-CM | POA: Diagnosis present

## 2022-01-19 DIAGNOSIS — Z6832 Body mass index (BMI) 32.0-32.9, adult: Secondary | ICD-10-CM | POA: Diagnosis not present

## 2022-01-19 DIAGNOSIS — Z885 Allergy status to narcotic agent status: Secondary | ICD-10-CM

## 2022-01-19 DIAGNOSIS — I4891 Unspecified atrial fibrillation: Principal | ICD-10-CM | POA: Diagnosis present

## 2022-01-19 DIAGNOSIS — Z79899 Other long term (current) drug therapy: Secondary | ICD-10-CM | POA: Diagnosis not present

## 2022-01-19 DIAGNOSIS — Z7982 Long term (current) use of aspirin: Secondary | ICD-10-CM | POA: Diagnosis not present

## 2022-01-19 DIAGNOSIS — E876 Hypokalemia: Secondary | ICD-10-CM | POA: Diagnosis present

## 2022-01-19 DIAGNOSIS — R197 Diarrhea, unspecified: Secondary | ICD-10-CM | POA: Diagnosis present

## 2022-01-19 DIAGNOSIS — I2510 Atherosclerotic heart disease of native coronary artery without angina pectoris: Secondary | ICD-10-CM

## 2022-01-19 DIAGNOSIS — I10 Essential (primary) hypertension: Secondary | ICD-10-CM

## 2022-01-19 DIAGNOSIS — I4819 Other persistent atrial fibrillation: Secondary | ICD-10-CM

## 2022-01-19 DIAGNOSIS — E669 Obesity, unspecified: Secondary | ICD-10-CM | POA: Diagnosis present

## 2022-01-19 DIAGNOSIS — E860 Dehydration: Secondary | ICD-10-CM | POA: Diagnosis present

## 2022-01-19 DIAGNOSIS — N179 Acute kidney failure, unspecified: Secondary | ICD-10-CM | POA: Diagnosis present

## 2022-01-19 LAB — TROPONIN I (HIGH SENSITIVITY)
Troponin I (High Sensitivity): 6 ng/L (ref <18)
Troponin I (High Sensitivity): 5 ng/L (ref <18)

## 2022-01-19 LAB — CBC WITH DIFFERENTIAL/PLATELET
Abs Immature Granulocytes: 0.01 10*3/uL (ref 0.00–0.07)
Basophils Absolute: 0 10*3/uL (ref 0.0–0.1)
Basophils Relative: 0 %
Eosinophils Absolute: 0 10*3/uL (ref 0.0–0.5)
Eosinophils Relative: 0 %
HCT: 51.9 % (ref 39.0–52.0)
Hemoglobin: 18.1 g/dL — ABNORMAL HIGH (ref 13.0–17.0)
Immature Granulocytes: 0 %
Lymphocytes Relative: 12 %
Lymphs Abs: 0.5 10*3/uL — ABNORMAL LOW (ref 0.7–4.0)
MCH: 28.7 pg (ref 26.0–34.0)
MCHC: 34.9 g/dL (ref 30.0–36.0)
MCV: 82.3 fL (ref 80.0–100.0)
Monocytes Absolute: 0.6 10*3/uL (ref 0.1–1.0)
Monocytes Relative: 14 %
Neutro Abs: 2.9 10*3/uL (ref 1.7–7.7)
Neutrophils Relative %: 74 %
Platelets: 185 10*3/uL (ref 150–400)
RBC: 6.31 MIL/uL — ABNORMAL HIGH (ref 4.22–5.81)
RDW: 14.5 % (ref 11.5–15.5)
WBC: 3.9 10*3/uL — ABNORMAL LOW (ref 4.0–10.5)
nRBC: 0 % (ref 0.0–0.2)

## 2022-01-19 LAB — BASIC METABOLIC PANEL
Anion gap: 10 (ref 5–15)
BUN: 15 mg/dL (ref 8–23)
CO2: 22 mmol/L (ref 22–32)
Calcium: 8.8 mg/dL — ABNORMAL LOW (ref 8.9–10.3)
Chloride: 103 mmol/L (ref 98–111)
Creatinine, Ser: 1.67 mg/dL — ABNORMAL HIGH (ref 0.61–1.24)
GFR, Estimated: 45 mL/min — ABNORMAL LOW (ref >60)
Glucose, Bld: 113 mg/dL — ABNORMAL HIGH (ref 70–99)
Potassium: 3.6 mmol/L (ref 3.5–5.1)
Sodium: 135 mmol/L (ref 135–145)

## 2022-01-19 LAB — C DIFFICILE QUICK SCREEN W PCR REFLEX
C Diff antigen: NEGATIVE
C Diff interpretation: NOT DETECTED
C Diff toxin: NEGATIVE

## 2022-01-19 LAB — BRAIN NATRIURETIC PEPTIDE: B Natriuretic Peptide: 62.5 pg/mL (ref 0.0–100.0)

## 2022-01-19 LAB — HEPARIN LEVEL (UNFRACTIONATED): Heparin Unfractionated: 0.89 IU/mL — ABNORMAL HIGH (ref 0.30–0.70)

## 2022-01-19 MED ORDER — HEPARIN (PORCINE) 25000 UT/250ML-% IV SOLN
1400.0000 [IU]/h | INTRAVENOUS | Status: DC
  Administered 2022-01-19: 1600 [IU]/h via INTRAVENOUS
  Administered 2022-01-20: 1400 [IU]/h via INTRAVENOUS
  Filled 2022-01-19 (×2): qty 250

## 2022-01-19 MED ORDER — ACETAMINOPHEN 650 MG RE SUPP: 650.0000 mg | Freq: Four times a day (QID) | RECTAL | Status: DC | PRN

## 2022-01-19 MED ORDER — ONDANSETRON HCL 4 MG PO TABS: 4.0000 mg | ORAL_TABLET | Freq: Four times a day (QID) | ORAL | Status: DC | PRN

## 2022-01-19 MED ORDER — ONDANSETRON HCL 4 MG/2ML IJ SOLN: 4.0000 mg | Freq: Four times a day (QID) | INTRAMUSCULAR | Status: DC | PRN

## 2022-01-19 MED ORDER — HEPARIN BOLUS VIA INFUSION
4000.0000 [IU] | Freq: Once | INTRAVENOUS | Status: AC
  Administered 2022-01-19: 4000 [IU] via INTRAVENOUS
  Filled 2022-01-19: qty 4000

## 2022-01-19 MED ORDER — METRONIDAZOLE 500 MG/100ML IV SOLN: 500.0000 mg | Freq: Three times a day (TID) | INTRAVENOUS | Status: DC

## 2022-01-19 MED ORDER — DILTIAZEM LOAD VIA INFUSION
10.0000 mg | Freq: Once | INTRAVENOUS | Status: AC
  Administered 2022-01-19: 10 mg via INTRAVENOUS
  Filled 2022-01-19: qty 10

## 2022-01-19 MED ORDER — ASPIRIN 81 MG PO TBEC
81.0000 mg | DELAYED_RELEASE_TABLET | Freq: Every morning | ORAL | Status: DC
  Administered 2022-01-20: 81 mg via ORAL
  Filled 2022-01-19: qty 1

## 2022-01-19 MED ORDER — DILTIAZEM HCL-DEXTROSE 125-5 MG/125ML-% IV SOLN (PREMIX)
5.0000 mg/h | INTRAVENOUS | Status: DC
  Administered 2022-01-19 – 2022-01-20 (×2): 5 mg/h via INTRAVENOUS
  Filled 2022-01-19 (×2): qty 125

## 2022-01-19 MED ORDER — ACETAMINOPHEN 325 MG PO TABS
650.0000 mg | ORAL_TABLET | Freq: Four times a day (QID) | ORAL | Status: DC | PRN
  Filled 2022-01-19: qty 2

## 2022-01-19 MED ORDER — SODIUM CHLORIDE 0.9 % IV SOLN: INTRAVENOUS | Status: DC

## 2022-01-19 NOTE — ED Triage Notes (Addendum)
Pt seen by PCP yesterday due to diarrhea since Monday, Pt noted to have new onset of A Fib, went back today for follow up EKG, Sent here due to A Fib, SHOB and lack of energy today. Stated on Cipro. Flagyl and ASA yesterday

## 2022-01-19 NOTE — H&P (Signed)
History and Physical    Patient: Calvin [name] [id number]:[id number] DOB: May 01, 1956 DOA: 01/19/2022 DOS: the patient was seen and examined on 01/19/2022 PCP: Waldemar Dickens, MD  Patient coming from: Home  Chief Complaint:  Chief Complaint  Patient presents with   Atrial Fibrillation   Shortness of Breath   Diarrhea   HPI: Calvin [name] is a 66 y.o. male with medical history significant of GERD, HTN, CAD. Presenting with diarrhea. Symptoms started 4 days ago. No fever. Mild generalized abdominal cramping. He started feeling weak and fatigued. So, he went to his jobsite physician. It was noted that he was in afib. He does not have a history of afib. He was given a couple liters of NS and started on flagyl/cipro for his diarrhea. He was sent home. Yesterday evening he drank a lot of fluids, but still felt weak and had some shortness of breath. So today when he returned to the [facility], he was still found to be in afib. It was recommended that he come to the ED for evaluation. He denies any other aggravating or alleviating factors.   Review of Systems: As mentioned in the history of present illness. All other systems reviewed and are negative. Past Medical History:  Diagnosis Date   Allergy    Family history of early CAD    GERD (gastroesophageal reflux disease)    Hypertension    Nasal turbinate hypertrophy    Past Surgical History:  Procedure Laterality Date   ANKLE ARTHRODESIS  1980   bone spur-right   breast cyst removal Right    COLONOSCOPY     CYST EXCISION  1979   right chest   NASAL SEPTOPLASTY W/ TURBINOPLASTY Bilateral 08/22/2019   Procedure: NASAL SEPTOPLASTY WITH TURBINATE REDUCTION;  Surgeon: Jodi Marble, MD;  Location: [facility];  Service: ENT;  Laterality: Bilateral;   Testicular Cyst Right    Social History:  reports that he has never smoked. He has never used smokeless tobacco. He reports current alcohol use. He reports that he does not use  drugs.  Allergies  Allergen Reactions   Codeine Itching    Family History  Problem Relation Age of Onset   Esophageal cancer Father    Colon cancer Neg Hx    Rectal cancer Neg Hx    Stomach cancer Neg Hx    Pancreatic cancer Neg Hx     Prior to Admission medications   Medication Sig Start Date End Date Taking? Authorizing Provider  acetaminophen (TYLENOL) 500 MG tablet Take 1,000-1,500 mg by mouth 2 (two) times daily as needed for fever.   Yes [provider]  aspirin EC 81 MG tablet Take 81 mg by mouth every morning. Swallow whole.   Yes [provider]  cholecalciferol (VITAMIN D3) 25 MCG (1000 UNIT) tablet Take 1,000 Units by mouth every morning.   Yes [provider]  ciprofloxacin (CIPRO) 500 MG tablet Take 500 mg by mouth 2 (two) times daily. 01/18/22  Yes [provider]  diclofenac Sodium (VOLTAREN) 1 % GEL Apply 1 Application topically daily as needed (joint pain).   Yes [provider]  hydrochlorothiazide (HYDRODIURIL) 25 MG tablet Take 25 mg by mouth every morning.   Yes [provider]  lisinopril (ZESTRIL) 20 MG tablet Take 20 mg by mouth every morning.   Yes [provider]  metroNIDAZOLE (FLAGYL) 500 MG tablet Take 500 mg by mouth 2 (two) times daily. 01/18/22  Yes [provider]  montelukast (SINGULAIR)  10 MG tablet Take 10 mg by mouth daily as needed (seasonal allergies). 11/07/21  Yes [provider]  Multiple Vitamins-Minerals (MULTIVITAMIN WITH MINERALS) tablet Take 1 tablet by mouth every morning.   Yes [provider]  omeprazole (PRILOSEC) 20 MG capsule Take 20 mg by mouth every morning.   Yes [provider]    Physical Exam: Vitals:   01/19/22 1215 01/19/22 1230 01/19/22 1245 01/19/22 1345  BP: 115/76 106/76 101/82 108/81  Pulse: 67 (!) 114 65 (!) 128  Resp: '20 17 14 15  '$ Temp:      SpO2: 100% 100% 99% 100%  Weight:      Height:       General: 66 y.o. male  resting in bed in NAD Eyes: PERRL, normal sclera ENMT: Nares patent w/o discharge, orophaynx clear, dentition normal, ears w/o discharge/lesions/ulcers Neck: Supple, trachea midline Cardiovascular: tachy irregular, +S1, S2, no m/g/r, equal pulses throughout Respiratory: CTABL, no w/r/r, normal WOB GI: BS+, NDNT, no masses noted, no organomegaly noted MSK: No e/c/c Neuro: A&O x 3, no focal deficits Psyc: Appropriate interaction and affect, calm/cooperative  Data Reviewed:  Na+  135 K+  3.6 Scr  1.67 BNP  62.5 Trp  6 -> 5 WBC  3.9 Hgb  18.1 Plt  185   EKG: afib, no st elevations  Assessment and Plan: New onset afib     - admit to inpt, progressive     - continue dilt gtt     - check echo     - cards consulted, appreciate assistance     - CHADVASC score is 2     - heparin gtt; spoke with him about oral AC options, he is weighing choices  Diarrhea     - can continue flagyl for now     - check c diff and GI PCR     - fluids  AKI Dehydration     - check renal US     - watch nephrotoxins     - fluids  CAD     - continue home regimen  GERD     - hold PPI  HTN     - can resume home regimen as BP supports  Advance Care Planning:   Code Status: FULL  Consults: Cardiology  Family Communication: None at bedside  Severity of Illness: The appropriate patient status for this patient is INPATIENT. Inpatient status is judged to be reasonable and necessary in order to provide the required intensity of service to ensure the patient's safety. The patient's presenting symptoms, physical exam findings, and initial radiographic and laboratory data in the context of their chronic comorbidities is felt to place them at high risk for further clinical deterioration. Furthermore, it is not anticipated that the patient will be medically stable for discharge from the hospital within 2 midnights of admission.   * I certify that at the point of admission it is my clinical judgment that  the patient will require inpatient hospital care spanning beyond 2 midnights from the point of admission due to high intensity of service, high risk for further deterioration and high frequency of surveillance required.*  Author: Jonnie Finner, DO 01/19/2022 2:08 PM  For on call review [email or web address].

## 2022-01-19 NOTE — Progress Notes (Signed)
ANTICOAGULATION CONSULT NOTE - Initial Consult  Pharmacy Consult for heparin Indication: atrial fibrillation  Allergies  Allergen Reactions   Codeine Itching    Patient Measurements: Height: '6\' 1"'$  (185.4 cm) Weight: 113.4 kg (250 lb) IBW/kg (Calculated) : 79.9 Heparin Dosing Weight: 104 kg  Vital Signs: Temp: 99.1 F (37.3 C) (07/06 1214) BP: 119/82 (07/06 1445) Pulse Rate: 103 (07/06 1445)  Labs: Recent Labs    01/19/22 1225  HGB 18.1*  HCT 51.9  PLT 185  CREATININE 1.67*  TROPONINIHS 6    Estimated Creatinine Clearance: 59 mL/min (A) (by C-G formula based on SCr of 1.67 mg/dL (H)).   Medical History: Past Medical History:  Diagnosis Date   Allergy    Family history of early CAD    GERD (gastroesophageal reflux disease)    Hypertension    Nasal turbinate hypertrophy     Medications: No anticoagulants PTA  Assessment: Pt is a 89 yoM presenting with SOB, afib. Pt is not prescribed any anticoagulants PTA. Pharmacy consulted to dose/monitor heparin.  Today, 01/19/22 CBC: Hgb elevated, Plt WNL SCr 1.67, CrCl ~60 mL/min  Goal of Therapy:  Heparin level 0.3-0.7 units/ml Monitor platelets by anticoagulation protocol: Yes   Plan:  Heparin bolus of 4000 units IV once Initiate heparin infusion at 1600 units/hr Check 6 hour HL HL, CBC daily while on heparin infusion Monitor for signs of bleeding  Lenis Noon, PharmD 01/19/2022,3:25 PM

## 2022-01-19 NOTE — ED Provider Triage Note (Signed)
Emergency Medicine Provider Triage Evaluation Note  Calvin [name] , a 66 y.o. male  was evaluated in triage.  Pt complains of feeling poorly, diarrhea onset Monday afternoon, seen at work clinic yesterday and started on Cipro and Flagyl for the diarrhea. Work clinic noted pt to be in a fib (no history of same), BP low with systolic around 825, given 2L NS. Recheck in clinic today, sent to ER.  Not on thinners, takes daily ASA Review of Systems  Positive: Abdomen sore, diarrhea, weak, SHOB Negative: fever  Physical Exam  BP 105/79 (BP Location: Right Arm)   Pulse (!) 120   Temp 99.1 F (37.3 C)   Resp 20   Ht '6\' 1"'$  (1.854 m)   Wt 113.4 kg   SpO2 100%   BMI 32.98 kg/m  Gen:   Awake, no distress   Resp:  Normal effort  MSK:   Moves extremities without difficulty  Other:  Appears to feel unwell, a fib rate 130s on monitor   Medical Decision Making  Medically screening exam initiated at 12:18 PM.  Appropriate orders placed.  Cable Fearn was informed that the remainder of the evaluation will be completed by another provider, this initial triage assessment does not replace that evaluation, and the importance of remaining in the ED until their evaluation is complete.     Tacy Learn, PA-C 01/19/22 1219

## 2022-01-19 NOTE — ED Provider Notes (Signed)
[facility] DEPT Provider Note   CSN: [id number] Arrival date & time: 01/19/22  1205     History  Chief Complaint  Patient presents with   Atrial Fibrillation   Shortness of Breath   Diarrhea    Calvin [name] is a 66 y.o. male.  66 year old male presents with A-fib and shortness of breath.  Patient states he has been feeling weak and tired recently.  Patient states that he has had watery diarrhea for several days without associated fever or abdominal pain.  He was recently placed on Cipro and Flagyl for the diarrhea.  Went to the nurse at his job and had blood work and was found to be in A-fib at that time.  He was also to be slightly hypotensive and given 2 L of saline.  States that he is unaware of when the A-fib could have started.  He has not had any severe chest pain or chest pressure.  He has had increased dyspnea on exertion.  Does note some orthopnea.  He went back to his job today for recheck and was found to remain in A-fib and was sent here for evaluation       Home Medications Prior to Admission medications   Medication Sig Start Date End Date Taking? Authorizing Provider  cholecalciferol (VITAMIN D3) 25 MCG (1000 UNIT) tablet Take 1,000 Units by mouth daily.    [provider]  hydrochlorothiazide (HYDRODIURIL) 25 MG tablet Take 25 mg by mouth daily.    [provider]  lisinopril (ZESTRIL) 20 MG tablet Take 20 mg by mouth daily.    [provider]  Multiple Vitamins-Minerals (MULTIVITAMIN WITH MINERALS) tablet Take 1 tablet by mouth daily.    [provider]  omeprazole (PRILOSEC) 20 MG capsule Take 20 mg by mouth daily.    [provider]      Allergies    Codeine    Review of Systems   Review of Systems  All other systems reviewed and are negative.   Physical Exam Updated Vital Signs BP 105/79 (BP Location: Right Arm)   Pulse (!) 120   Temp 99.1 F (37.3 C)   Resp 20   Ht  1.854 m ('6\' 1"'$ )   Wt 113.4 kg   SpO2 100%   BMI 32.98 kg/m  Physical Exam Vitals and nursing note reviewed.  Constitutional:      General: He is not in acute distress.    Appearance: Normal appearance. He is well-developed. He is not toxic-appearing.  HENT:     Head: Normocephalic and atraumatic.  Eyes:     General: Lids are normal.     Conjunctiva/sclera: Conjunctivae normal.     Pupils: Pupils are equal, round, and reactive to light.  Neck:     Thyroid: No thyroid mass.     Trachea: No tracheal deviation.  Cardiovascular:     Rate and Rhythm: Tachycardia present. Rhythm irregular.     Heart sounds: Normal heart sounds. No murmur heard.    No gallop.  Pulmonary:     Effort: Pulmonary effort is normal. No respiratory distress.     Breath sounds: Normal breath sounds. No stridor. No decreased breath sounds, wheezing, rhonchi or rales.  Abdominal:     General: There is no distension.     Palpations: Abdomen is soft.     Tenderness: There is no abdominal tenderness. There is no rebound.  Musculoskeletal:        General: No tenderness. Normal range  of motion.     Cervical back: Normal range of motion and neck supple.  Skin:    General: Skin is warm and dry.     Findings: No abrasion or rash.  Neurological:     Mental Status: He is alert and oriented to person, place, and time. Mental status is at baseline.     GCS: GCS eye subscore is 4. GCS verbal subscore is 5. GCS motor subscore is 6.     Cranial Nerves: No cranial nerve deficit.     Sensory: No sensory deficit.     Motor: Motor function is intact.  Psychiatric:        Attention and Perception: Attention normal.        Speech: Speech normal.        Behavior: Behavior normal.     ED Results / Procedures / Treatments   Labs (all labs ordered are listed, but only abnormal results are displayed) Labs Reviewed  CBC WITH DIFFERENTIAL/PLATELET  BRAIN NATRIURETIC PEPTIDE  BASIC METABOLIC PANEL  TROPONIN I (HIGH  SENSITIVITY)    EKG EKG Interpretation  Date/Time:  Thursday January 19 2022 12:11:39 EDT Ventricular Rate:  120 PR Interval:    QRS Duration: 103 QT Interval:  315 QTC Calculation: 456 R Axis:   73 Text Interpretation: Atrial fibrillation Borderline repolarization abnormality Confirmed by Lacretia Leigh ([id number]) on 01/19/2022 12:31:02 PM  Radiology No results found.  Procedures Procedures    Medications Ordered in ED Medications  0.9 %  sodium chloride infusion (has no administration in time range)  diltiazem (CARDIZEM) 1 mg/mL load via infusion 10 mg (has no administration in time range)    And  diltiazem (CARDIZEM) 125 mg in dextrose 5% 125 mL (1 mg/mL) infusion (has no administration in time range)    ED Course/ Medical Decision Making/ A&P                           Medical Decision Making Amount and/or Complexity of Data Reviewed Labs: ordered. Radiology: ordered.  Risk Prescription drug management.   Patient's EKG per my interpretation shows atrial fibrillation.  Patient started on Cardizem drip and heart rate has improved.  Concern for possible CHF but chest x-ray without i pulmonary edema and BNP is normal.  Patient does have evidence of acute kidney injury.  Patient will require admission.  Discussed with hospitalist        Final Clinical Impression(s) / ED Diagnoses Final diagnoses:  None    Rx / DC Orders ED Discharge Orders     None         Lacretia Leigh, MD 01/19/22 [phone]

## 2022-01-20 ENCOUNTER — Inpatient Hospital Stay (HOSPITAL_COMMUNITY): Payer: 59

## 2022-01-20 DIAGNOSIS — N179 Acute kidney failure, unspecified: Secondary | ICD-10-CM | POA: Diagnosis not present

## 2022-01-20 DIAGNOSIS — I4891 Unspecified atrial fibrillation: Secondary | ICD-10-CM

## 2022-01-20 LAB — CBC
HCT: 49 % (ref 39.0–52.0)
Hemoglobin: 16.6 g/dL (ref 13.0–17.0)
MCH: 28.1 pg (ref 26.0–34.0)
MCHC: 33.9 g/dL (ref 30.0–36.0)
MCV: 82.9 fL (ref 80.0–100.0)
Platelets: 170 10*3/uL (ref 150–400)
RBC: 5.91 MIL/uL — ABNORMAL HIGH (ref 4.22–5.81)
RDW: 14.3 % (ref 11.5–15.5)
WBC: 2.4 10*3/uL — ABNORMAL LOW (ref 4.0–10.5)
nRBC: 0 % (ref 0.0–0.2)

## 2022-01-20 LAB — ECHOCARDIOGRAM COMPLETE
AR max vel: 2.29 cm2
AV Area VTI: 2.23 cm2
AV Area mean vel: 2.12 cm2
AV Mean grad: 3.3 mmHg
AV Peak grad: 5.1 mmHg
Ao pk vel: 1.13 m/s
Area-P 1/2: 4.6 cm2
Height: 73 in
MV VTI: 1.83 cm2
S' Lateral: 3.4 cm
Weight: 4000 oz

## 2022-01-20 LAB — GASTROINTESTINAL PANEL BY PCR, STOOL (REPLACES STOOL CULTURE)
Adenovirus F40/41: NOT DETECTED
Astrovirus: NOT DETECTED
Campylobacter species: NOT DETECTED
Cryptosporidium: NOT DETECTED
Cyclospora cayetanensis: NOT DETECTED
Entamoeba histolytica: NOT DETECTED
Enteroaggregative E coli (EAEC): DETECTED — AB
Enteropathogenic E coli (EPEC): NOT DETECTED
Enterotoxigenic E coli (ETEC): NOT DETECTED
Giardia lamblia: NOT DETECTED
Norovirus GI/GII: NOT DETECTED
Plesimonas shigelloides: NOT DETECTED
Rotavirus A: DETECTED — AB
Salmonella species: NOT DETECTED
Sapovirus (I, II, IV, and V): NOT DETECTED
Shiga like toxin producing E coli (STEC): NOT DETECTED
Shigella/Enteroinvasive E coli (EIEC): NOT DETECTED
Vibrio cholerae: NOT DETECTED
Vibrio species: NOT DETECTED
Yersinia enterocolitica: NOT DETECTED

## 2022-01-20 LAB — COMPREHENSIVE METABOLIC PANEL
ALT: 71 U/L — ABNORMAL HIGH (ref 0–44)
AST: 41 U/L (ref 15–41)
Albumin: 3.6 g/dL (ref 3.5–5.0)
Alkaline Phosphatase: 47 U/L (ref 38–126)
Anion gap: 11 (ref 5–15)
BUN: 12 mg/dL (ref 8–23)
CO2: 21 mmol/L — ABNORMAL LOW (ref 22–32)
Calcium: 7.9 mg/dL — ABNORMAL LOW (ref 8.9–10.3)
Chloride: 103 mmol/L (ref 98–111)
Creatinine, Ser: 1.14 mg/dL (ref 0.61–1.24)
GFR, Estimated: >60 mL/min (ref >60)
Glucose, Bld: 99 mg/dL (ref 70–99)
Potassium: 3 mmol/L — ABNORMAL LOW (ref 3.5–5.1)
Sodium: 135 mmol/L (ref 135–145)
Total Bilirubin: 0.7 mg/dL (ref 0.3–1.2)
Total Protein: 6.7 g/dL (ref 6.5–8.1)

## 2022-01-20 LAB — HIV ANTIBODY (ROUTINE TESTING W REFLEX): HIV Screen 4th Generation wRfx: NONREACTIVE

## 2022-01-20 LAB — HEPARIN LEVEL (UNFRACTIONATED): Heparin Unfractionated: 0.87 IU/mL — ABNORMAL HIGH (ref 0.30–0.70)

## 2022-01-20 LAB — TSH: TSH: 1.615 u[IU]/mL (ref 0.350–4.500)

## 2022-01-20 MED ORDER — CIPROFLOXACIN HCL 500 MG PO TABS
500.0000 mg | ORAL_TABLET | Freq: Two times a day (BID) | ORAL | Status: DC
Start: 2022-01-20 — End: 2022-01-21
  Administered 2022-01-20 – 2022-01-21 (×3): 500 mg via ORAL
  Filled 2022-01-20 (×3): qty 1

## 2022-01-20 MED ORDER — HEPARIN (PORCINE) 25000 UT/250ML-% IV SOLN
1200.0000 [IU]/h | INTRAVENOUS | Status: DC
  Filled 2022-01-20: qty 250

## 2022-01-20 MED ORDER — APIXABAN 5 MG PO TABS
5.0000 mg | ORAL_TABLET | Freq: Two times a day (BID) | ORAL | Status: DC
  Administered 2022-01-20 – 2022-01-21 (×3): 5 mg via ORAL
  Filled 2022-01-20 (×3): qty 1

## 2022-01-20 MED ORDER — SODIUM CHLORIDE 0.9 % IV BOLUS
1000.0000 mL | Freq: Once | INTRAVENOUS | Status: AC
  Administered 2022-01-20: 1000 mL via INTRAVENOUS

## 2022-01-20 MED ORDER — CALCIUM CARBONATE ANTACID 500 MG PO CHEW
1.0000 | CHEWABLE_TABLET | Freq: Three times a day (TID) | ORAL | Status: DC | PRN
Start: 2022-01-20 — End: 2022-01-21
  Administered 2022-01-20: 200 mg via ORAL
  Filled 2022-01-20: qty 1

## 2022-01-20 MED ORDER — DILTIAZEM HCL ER COATED BEADS 180 MG PO CP24
180.0000 mg | ORAL_CAPSULE | Freq: Every day | ORAL | Status: DC
  Administered 2022-01-20 – 2022-01-21 (×2): 180 mg via ORAL
  Filled 2022-01-20 (×2): qty 1

## 2022-01-20 MED ORDER — POTASSIUM CHLORIDE CRYS ER 20 MEQ PO TBCR
40.0000 meq | EXTENDED_RELEASE_TABLET | ORAL | Status: AC
Start: 2022-01-20 — End: 2022-01-20
  Administered 2022-01-20 (×2): 40 meq via ORAL
  Filled 2022-01-20: qty 2

## 2022-01-20 MED ORDER — TRAZODONE HCL 50 MG PO TABS
50.0000 mg | ORAL_TABLET | Freq: Every evening | ORAL | Status: DC | PRN
Start: 2022-01-20 — End: 2022-01-21
  Administered 2022-01-20: 50 mg via ORAL
  Filled 2022-01-20: qty 1

## 2022-01-20 NOTE — Progress Notes (Signed)
ANTICOAGULATION CONSULT NOTE   Pharmacy Consult for heparin Indication: atrial fibrillation  Allergies  Allergen Reactions   Codeine Itching    Patient Measurements: Height: '6\' 1"'$  (185.4 cm) Weight: 113.4 kg (250 lb) IBW/kg (Calculated) : 79.9 Heparin Dosing Weight: 104 kg  Vital Signs: Temp: 98.6 F (37 C) (07/06 2204) Temp Source: Oral (07/06 2204) BP: 126/77 (07/06 2204) Pulse Rate: 93 (07/06 2204)  Labs: Recent Labs    01/19/22 1225 01/19/22 1509 01/19/22 2145  HGB 18.1*  --   --   HCT 51.9  --   --   PLT 185  --   --   HEPARINUNFRC  --   --  0.89*  CREATININE 1.67*  --   --   TROPONINIHS 6 5  --      Estimated Creatinine Clearance: 59 mL/min (A) (by C-G formula based on SCr of 1.67 mg/dL (H)).   Medical History: Past Medical History:  Diagnosis Date   Allergy    Family history of early CAD    GERD (gastroesophageal reflux disease)    Hypertension    Nasal turbinate hypertrophy     Medications: No anticoagulants PTA  Assessment: Pt is a 41 yoM presenting with SOB, afib. Pt is not prescribed any anticoagulants PTA. Pharmacy consulted to dose/monitor heparin.  Today, 01/20/22 Heparin level = 0.89 (supratherapeutic) with heparin gtt @ 1600 units/hr RN reports no complications in therapy  Goal of Therapy:  Heparin level 0.3-0.7 units/ml Monitor platelets by anticoagulation protocol: Yes   Plan:  Decrease heparin infusion to 1400 units/hr Check heparin level 6 hr after rate decrease HL, CBC daily while on heparin infusion Monitor for signs of bleeding  Everette Rank, PharmD 01/20/2022,12:20 AM

## 2022-01-20 NOTE — Consult Note (Addendum)
Cardiology Consultation:   Patient ID: Calvin [name] MRN: [id number]; DOB: [birth year]  Admit date: 01/19/2022 Date of Consult: 01/20/2022  PCP:  Waldemar Dickens, MD   [place] Providers Cardiologist:  New   Patient Profile:   Calvin [name] is a 66 y.o. male with a hx of HTN and GERD who is being seen 01/20/2022 for the evaluation of Atrial fibrillation at the request of Dr. Marylyn Ishihara.  Patient had normal ETT and echo in 2021. This was order by PCP for routine cardiac evaluation.  Calcium score of 0 on CT cardiac scoring 09/2021.  Grandfather had MI in 87s.   History of Present Illness:   Calvin [name] was in usual state of health up until Monday when he started to having multiple episodes of nonbloody diarrhea.  Low appetite and fatigue.  Due to ongoing symptoms, he was seen by PCP on Wednesday.  He was diagnosed with severe dehydration and given IV fluid in clinic.  Also started on antibiotic.   He was found to be in atrial fibrillation on exam and EKG.  He was sent home and advised to drink fluids.  He had no appetite but he continued to drink Gatorade.  Yesterday he again went to see PCP and he remained in atrial fibrillation.  Advised to come to ER for further evaluation.  Upon arrival, heart rate found to be in the 120s.  He was admitted for A-fib RVR and AKI with serum creatinine of 1.67.  Started on IV diltiazem with improved blood pressure.  Also getting fluids.  Remains weak.  Appetite is improving.  Chest and abdominal x-ray without acute findings. Renal ultrasound reassuring Pending echocardiogram   Patient is a non-smoker.  Social alcohol drinking.  Denies daytime tiredness/sleepiness or snoring.  No orthopnea, PND, syncope, lower extremity edema, palpitation or melena.  Past Medical History:  Diagnosis Date   Allergy    Family history of early CAD    GERD (gastroesophageal reflux disease)    Hypertension    Nasal turbinate hypertrophy     Past Surgical History:   Procedure Laterality Date   ANKLE ARTHRODESIS  1980   bone spur-right   breast cyst removal Right    COLONOSCOPY     CYST EXCISION  1979   right chest   NASAL SEPTOPLASTY W/ TURBINOPLASTY Bilateral 08/22/2019   Procedure: NASAL SEPTOPLASTY WITH TURBINATE REDUCTION;  Surgeon: Jodi Marble, MD;  Location: [place];  Service: ENT;  Laterality: Bilateral;   Testicular Cyst Right     Inpatient Medications: Scheduled Meds:  aspirin EC  81 mg Oral q morning   potassium chloride  40 mEq Oral Q3H   Continuous Infusions:  sodium chloride 125 mL/hr at 01/20/22 0340   diltiazem (CARDIZEM) infusion 5 mg/hr (01/20/22 0529)   heparin 1,400 Units/hr (01/20/22 0533)   PRN Meds: acetaminophen **OR** acetaminophen, ondansetron **OR** ondansetron (ZOFRAN) IV  Allergies:    Allergies  Allergen Reactions   Codeine Itching    Social History:   Social History   Socioeconomic History   Marital status: Married    Spouse name: Not on file   Number of children: Not on file   Years of education: Not on file   Highest education level: Not on file  Occupational History   Not on file  Tobacco Use   Smoking status: Never   Smokeless tobacco: Never  Vaping Use   Vaping Use: Never used  Substance and Sexual Activity   Alcohol use: Yes  Comment: occ   Drug use: No   Sexual activity: Not on file  Other Topics Concern   Not on file  Social History Narrative   Not on file   Social Determinants of Health   Financial Resource Strain: Not on file  Food Insecurity: Not on file  Transportation Needs: Not on file  Physical Activity: Not on file  Stress: Not on file  Social Connections: Not on file  Intimate Partner Violence: Not on file    Family History:    Family History  Problem Relation Age of Onset   Esophageal cancer Father    Colon cancer Neg Hx    Rectal cancer Neg Hx    Stomach cancer Neg Hx    Pancreatic cancer Neg Hx      ROS:  Please see the history  of present illness.   All other ROS reviewed and negative.     Physical Exam/Data:   Vitals:   01/19/22 1800 01/19/22 2204 01/20/22 0126 01/20/22 0312  BP: 123/86 126/77 (!) 129/93 (!) 133/91  Pulse: 90 93 88 80  Resp: '16 20 17 20  '$ Temp: 98.4 F (36.9 C) 98.6 F (37 C) 98.5 F (36.9 C) 98.2 F (36.8 C)  TempSrc: Oral Oral Oral Oral  SpO2: 99% 98% 97% 99%  Weight:      Height:        Intake/Output Summary (Last 24 hours) at 01/20/2022 0927 Last data filed at 01/20/2022 0533 Gross per 24 hour  Intake 1803.27 ml  Output 6 ml  Net 1797.27 ml      01/19/2022   12:11 PM 08/22/2019   11:18 AM 08/12/2019   11:22 AM  Last 3 Weights  Weight (lbs) 250 lb 253 lb 1.4 oz 258 lb  Weight (kg) 113.399 kg 114.8 kg 117.028 kg     Body mass index is 32.98 kg/m.  General:  Well nourished, well developed, in no acute distress HEENT: normal Neck: no JVD Vascular: No carotid bruits; Distal pulses 2+ bilaterally Cardiac:  normal S1, S2; irregularly irregular; no murmur  Lungs:  clear to auscultation bilaterally, no wheezing, rhonchi or rales  Abd: soft, nontender, no hepatomegaly  Ext: no edema Musculoskeletal:  No deformities, BUE and BLE strength normal and equal Skin: warm and dry  Neuro:  CNs 2-12 intact, no focal abnormalities noted Psych:  Normal affect   EKG:  The EKG was personally reviewed and demonstrates: Atrial fibrillation with rapid ventricular rate Telemetry:  Telemetry was personally reviewed and demonstrates: Atrial fibrillation with improved heart rate  Relevant CV Studies:  CT cardiac scoring 09/2021 IMPRESSION: 1. Coronary calcium score is 0. 2. Hepatic cysts. 3. 2 mm right lung nodule. No follow-up needed if patient is low-risk. Non-contrast chest CT can be considered in 12 months if patient is high-risk. This recommendation follows the consensus statement: Guidelines for Management of Incidental Pulmonary Nodules Detected on CT Images: From the Fleischner Society  2017; Radiology 2017; 284:228-243  ETT 08/20/2019 Excellent exercise capacity (13.2 METS) Upsloping ST segment depression during stress that likely represents normal J point depression with exercise. No evidence of ischemia  Echo 07/2019 1. Left ventricular ejection fraction, by visual estimation, is 60 to  65%. The left ventricle has normal function. There is no left ventricular  hypertrophy.   2. The left ventricle has no regional wall motion abnormalities.   3. Global right ventricle has normal systolic function.The right  ventricular size is normal. No increase in right ventricular wall  thickness.  4. Left atrial size was normal.   5. Right atrial size was normal.   6. The mitral valve is normal in structure. Trivial mitral valve  regurgitation. No evidence of mitral stenosis.   7. The tricuspid valve is grossly normal.   8. The tricuspid valve is grossly normal. Tricuspid valve regurgitation  is not demonstrated.   9. The aortic valve is normal in structure. Aortic valve regurgitation is  not visualized. No evidence of aortic valve sclerosis or stenosis.  10. The pulmonic valve was grossly normal. Pulmonic valve regurgitation is  not visualized.  11. The atrial septum is grossly normal.   FINDINGS  Laboratory Data:  High Sensitivity Troponin:   Recent Labs  Lab 2022-02-14 1225 14-Feb-2022 1509  TROPONINIHS 6 5     Chemistry Recent Labs  Lab 02/14/2022 1225 01/20/22 0823  NA 135 135  K 3.6 3.0*  CL 103 103  CO2 22 21*  GLUCOSE 113* 99  BUN 15 12  CREATININE 1.67* 1.14  CALCIUM 8.8* 7.9*  GFRNONAA 45* >60  ANIONGAP 10 11     Hematology Recent Labs  Lab 02-14-2022 1225 01/20/22 0823  WBC 3.9* 2.4*  RBC 6.31* 5.91*  HGB 18.1* 16.6  HCT 51.9 49.0  MCV 82.3 82.9  MCH 28.7 28.1  MCHC 34.9 33.9  RDW 14.5 14.3  PLT 185 170   BNP Recent Labs  Lab 02-14-22 1225  BNP 62.5     Radiology/Studies:  US RENAL  Result Date: 2022/02/14 CLINICAL DATA:  Acute  kidney injury. EXAM: RENAL / URINARY TRACT ULTRASOUND COMPLETE COMPARISON:  None Available. FINDINGS: Right Kidney: Renal measurements: 11.9 cm x 6.2 cm x 4.9 cm = volume: 190.2 mL. Echogenicity within normal limits. No mass or hydronephrosis visualized. Left Kidney: Renal measurements: 12.2 cm x 5.2 cm x 6.7 cm = volume: 223.5 mL. Echogenicity within normal limits. No mass or hydronephrosis visualized. Bladder: Appears normal for degree of bladder distention. Other: None. IMPRESSION: Normal renal ultrasound. Electronically Signed   By: Virgina Norfolk M.D.   On: 02-14-2022 19:34   DG Abd 1 View  Result Date: 02-14-2022 CLINICAL DATA:  Diarrhea, shortness of breath, and new onset atrial fibrillation. EXAM: ABDOMEN - 1 VIEW COMPARISON:  None Available. FINDINGS: Gas is present in nondilated colon and rectum, and there is a small amount of small bowel gas as well. No dilated loops of bowel are seen to suggest obstruction. Small calcifications in the pelvis on the right may reflect phleboliths. The visualized lung bases are clear. No acute osseous abnormality is identified. IMPRESSION: Nonobstructed bowel gas pattern. Electronically Signed   By: Logan Bores M.D.   On: 14-Feb-2022 16:19   DG Chest 2 View  Result Date: 2022/02/14 CLINICAL DATA:  Shortness of breath. EXAM: CHEST - 2 VIEW COMPARISON:  CT scan 09/27/2021 FINDINGS: The cardiac silhouette, mediastinal and hilar contours are within normal limits. The lungs are clear of an acute process. No pleural effusions or pulmonary lesions. The bony thorax is intact. IMPRESSION: No acute cardiopulmonary findings. Electronically Signed   By: Marijo Sanes M.D.   On: February 14, 2022 13:42     Assessment and Plan:   New onset atrial fibrillation with rapid ventricular rate -Likely current episode precipitated by severe dehydration secondary to diarrhea.  -Heart rate is improving on IV Cardizem 5 mg/h.  We will continue for now.  -CHA2DS2-VASc score of 1 for  hypertension.  Continue heparin for anticoagulation. -His main complaint is weakness, this is likely due to  severe dehydration and poor p.o. intake greater than atrial fibrillation. -Pending echocardiogram.  Had normal atria on prior echocardiogram.  2.  Hypertension -Home hydrochlorothiazide and lisinopril on hold. -Currently on IV Cardizem for rate control  3.  Hypokalemia -Likely dilutional or due to poor po intake. Agree with supplementation  4.  AKI -Resolved with hydration  5.  Diarrhea -Improving.  Per primary team.   Risk Assessment/Risk Scores:   CHA2DS2-VASc Score = 1   This indicates a 0.6% annual risk of stroke. The patient's score is based upon: CHF History: 0 HTN History: 1 Diabetes History: 0 Stroke History: 0 Vascular Disease History: 0 Age Score: 0 Gender Score: 0   For questions or updates, please contact [place] Please consult [email or web address] for contact info under    SignedLeanor Kail, PA  01/20/2022 9:27 AM   I have seen and examined the patient along with Leanor Kail, PA .  I have reviewed the chart, notes and new data.  I agree with PA/NP's note.  Key new complaints: Remains arrhythmia unaware.  Still having diarrhea.  No other cardiovascular complaints.  Does not have any symptoms of daytime hypersomnolence that would suggest sleep apnea.  He has been told years ago that he snored, but his wife has not complained about it recently. Key examination changes: Mildly obese.  Irregular rhythm but otherwise normal cardiovascular exam. Key new findings / data: Preliminary review of the echocardiogram shows normal left and right ventricular systolic function.  However both the right and left atria are mildly dilated.  This suggests some chronicity to the rhythm abnormality.  There are no serious valvular abnormalities, regional wall motion abnormalities or pericardial disease.  Compared the images side-by-side with the study from 2021 and  indeed just 2 years ago left and right atrial sizes appeared smaller, in normal range.  This is rather surprising.  It is possible that he has a high burden of atrial fibrillation then we realize due to atrial myopathy.  A less likely explanation would be that he is in the early stages of a restrictive cardiomyopathy such as cardiac amyloidosis.    PLAN: I would assume that this is not just an isolated episode of atrial fibrillation related to the acute gastrointestinal problem.  I suspect that he will have atrial fibrillation again.  CHA2DS2-VASc score is only 1 at this time (for HTN), but in just 6 more months he will turn 67 and the score will be 2.  I think he meets criteria for long-term oral anticoagulant.  We discussed the pros and cons of oral anticoagulation taking into account his hobbies (showing horses, amateur pilot).  I still believe that it's more likely he will benefit from oral anticoagulants then have side effects from them.  I would recommend starting the anticoagulation now, reevaluating him in 3 weeks.  If he remains in persistent atrial fibrillation would go ahead with an electrical cardioversion.  Consider outpatient cardiac MRI and/or PYP scan to further clarify the possibility of a restrictive cardiomyopathy.  A follow-up echo in sinus rhythm could also be helpful.  This evaluation is not urgent.  Transition from intravenous heparin to oral anticoagulant and from intravenous diltiazem to diltiazem CD 180 mg once daily.  This medication can replace one of his outpatient oral antihypertensives, such as the hydrochlorothiazide.  Sanda Klein, MD, [place] [phone] 01/20/2022, 2:37 PM

## 2022-01-20 NOTE — Progress Notes (Signed)
*  PRELIMINARY RESULTS* Echocardiogram 2D Echocardiogram has been performed.  Calvin [name] 01/20/2022, 1:05 PM

## 2022-01-20 NOTE — Progress Notes (Signed)
ANTICOAGULATION CONSULT NOTE   Pharmacy Consult for heparin Indication: atrial fibrillation  Allergies  Allergen Reactions   Codeine Itching    Patient Measurements: Height: '6\' 1"'$  (185.4 cm) Weight: 113.4 kg (250 lb) IBW/kg (Calculated) : 79.9 Heparin Dosing Weight: 104 kg  Vital Signs: Temp: 98.2 F (36.8 C) (07/07 0312) Temp Source: Oral (07/07 0312) BP: 133/91 (07/07 0312) Pulse Rate: 80 (07/07 0312)  Labs: Recent Labs    01/19/22 1225 01/19/22 1509 01/19/22 2145 01/20/22 0823  HGB 18.1*  --   --  16.6  HCT 51.9  --   --  49.0  PLT 185  --   --  170  HEPARINUNFRC  --   --  0.89* 0.87*  CREATININE 1.67*  --   --  1.14  TROPONINIHS 6 5  --   --      Estimated Creatinine Clearance: 86.4 mL/min (by C-G formula based on SCr of 1.14 mg/dL).   Medical History: Past Medical History:  Diagnosis Date   Allergy    Family history of early CAD    GERD (gastroesophageal reflux disease)    Hypertension    Nasal turbinate hypertrophy     Medications: No anticoagulants PTA  Assessment: Pt is a 43 yoM presenting with SOB, afib. Pt is not prescribed any anticoagulants PTA. Pharmacy consulted to dose/monitor heparin.  Today, 01/20/22 Heparin level = 0.87 (supratherapeutic) with heparin gtt @ 1400 units/hr RN reports no complications in therapy  Goal of Therapy:  Heparin level 0.3-0.7 units/ml Monitor platelets by anticoagulation protocol: Yes   Plan:  Decrease heparin infusion to 1200 units/hr Check heparin level 6 hr after rate decrease HL, CBC daily while on heparin infusion Monitor for signs of bleeding F/U plans for oral anticoagulation   Thank you for allowing pharmacy to be a part of this patient's care.  Royetta Asal, PharmD, BCPS Clinical Pharmacist [place] Please utilize Amion for appropriate phone number to reach the unit pharmacist ([place]) 01/20/2022 9:27 AM

## 2022-01-20 NOTE — TOC Initial Note (Signed)
Transition of Care [facility]) - Initial/Assessment Note    Patient Details  Name: Calvin [name] MRN: [id number] Date of Birth: 02-08-1956  [facility]) CM/SW Contact:    Leeroy Cha, RN Phone Number: 01/20/2022, 9:14 AM  Clinical Narrative:                  Transition of Care [facility]) Screening Note   Patient Details  Name: Calvin [name] Date of Birth: 1956-01-17   Transition of [facility]) CM/SW Contact:    Leeroy Cha, RN Phone Number: 01/20/2022, 9:14 AM    Transition of Care Department [facility]) has reviewed patient and no TOC needs have been identified at this time. We will continue to monitor patient advancement through interdisciplinary progression rounds. If new patient transition needs arise, please place a TOC consult.    Expected Discharge Plan: Home/Self Care Barriers to Discharge: Continued Medical Work up   Patient Goals and CMS Choice Patient states their goals for this hospitalization and ongoing recovery are:: to go home CMS Medicare.gov Compare Post Acute Care list provided to:: Patient    Expected Discharge Plan and Services Expected Discharge Plan: Home/Self Care   Discharge Planning Services: CM Consult   Living arrangements for the past 2 months: Single Family Home                                      Prior Living Arrangements/Services Living arrangements for the past 2 months: Single Family Home Lives with:: Spouse Patient language and need for interpreter reviewed:: Yes Do you feel safe going back to the place where you live?: Yes            Criminal Activity/Legal Involvement Pertinent to Current Situation/Hospitalization: No - Comment as needed  Activities of Daily Living Home Assistive Devices/Equipment: None ADL Screening (condition at time of admission) Patient's cognitive ability adequate to safely complete daily activities?: Yes Is the patient deaf or have difficulty hearing?: No Does the patient have  difficulty seeing, even when wearing glasses/contacts?: No Does the patient have difficulty concentrating, remembering, or making decisions?: No Patient able to express need for assistance with ADLs?: No Does the patient have difficulty dressing or bathing?: No Independently performs ADLs?: Yes (appropriate for developmental age) Does the patient have difficulty walking or climbing stairs?: No Weakness of Legs: None Weakness of Arms/Hands: None  Permission Sought/Granted                  Emotional Assessment Appearance:: Appears stated age     Orientation: : Oriented to Place, Oriented to Self, Oriented to  Time, Oriented to Situation Alcohol / Substance Use: Not Applicable Psych Involvement: No (comment)  Admission diagnosis:  Atrial fibrillation with rapid ventricular response ([place]) [I48.91] Atrial fibrillation with RVR ([place]) [I48.91] Patient Active Problem List   Diagnosis Date Noted   New onset atrial fibrillation ([place]) 01/19/2022   HTN (hypertension) 01/19/2022   GERD (gastroesophageal reflux disease) 01/19/2022   CAD (coronary artery disease) 01/19/2022   AKI (acute kidney injury) ([place]) 01/19/2022   Dehydration 01/19/2022   Diarrhea 01/19/2022   PCP:  Waldemar Dickens, MD Pharmacy[facility]   [id number]: [phone]: [phone]    Social Determinants of Health (SDOH) Interventions    Readmission Risk Interventions     No data to display

## 2022-01-20 NOTE — Discharge Instructions (Signed)

## 2022-01-20 NOTE — Progress Notes (Signed)
PROGRESS NOTE  Calvin [name] GYJ:[id number] DOB: 10-04-1955 DOA: 01/19/2022 PCP: Waldemar Dickens, MD   LOS: 1 day   Brief Narrative / Interim history: 66 year old male with history of GERD, HTN, comes into the hospital with diarrhea.  This has been going on since Monday.  Over the past weekend, he was out of state and ate out in different places.  No significant unusual foods but apparently few folks were in the same environment got sick with GI illness but of a much shorter duration.  He is followed by his jobsite physician 2 days ago, was found to be in A-fib and dehydrated, received fluids and was sent home.  On repeat visit he was still in A-fib and symptomatic so was sent here  Subjective / 24h Interval events: Reports ongoing diarrhea, about 8 bowel movements since last night.  No chest pain or palpitations  Assesement and Plan: Active Problems:   New onset atrial fibrillation (HCC)   HTN (hypertension)   GERD (gastroesophageal reflux disease)   CAD (coronary artery disease)   AKI (acute kidney injury) ([place])   Dehydration   Diarrhea  Principal problem Diarrheal illness due to E coli and rotavirus-possibly related to food poisoning.  C. difficile was negative.  GI pathogen panel did reveal enteroaggregative  E. coli as well as rotavirus.  Due to severity has been placed on ciprofloxacin.  Continue.  Hold PPI for now  Active problems New onset A-fib with RVR-he has been placed on diltiazem drip as well as heparin.  Cardiology consulted, appreciate input.  2D echo is pending.  He is rate controlled this morning but remains in A-fib.  Acute kidney injury-creatinine on admission 1.67, has received IV fluids and improved to 1.14 this morning.  ACE inhibitor and HCTZ has been on hold, continue to hold.  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 Yes; Fluid consistency: Thin  Diet effective now       Question Answer Comment  Room service appropriate? Yes   Fluid consistency: Thin      01/19/22 1803            DVT prophylaxis: heparin infusion   Lab Results  Component Value Date   PLT 170 01/20/2022      Code Status: Full Code  Family Communication: no family at bedside   Status is: Inpatient  Remains inpatient appropriate because: Remains in A-fib, on diltiazem drip  Level of care: Progressive  Consultants:  Cardiology  Procedures:  2D echo: pending  Objective: Vitals:   01/19/22 1800 01/19/22 2204 01/20/22 0126 01/20/22 0312  BP: 123/86 126/77 (!) 129/93 (!) 133/91  Pulse: 90 93 88 80  Resp: '16 20 17 20  '$ Temp: 98.4 F (36.9 C) 98.6 F (37 C) 98.5 F (36.9 C) 98.2 F (36.8 C)  TempSrc: Oral Oral Oral Oral  SpO2: 99%  98% 97% 99%  Weight:      Height:        Intake/Output Summary (Last 24 hours) at 01/20/2022 1054 Last data filed at 01/20/2022 0900 Gross per 24 hour  Intake 2043.27 ml  Output 6 ml  Net 2037.27 ml   Wt Readings from Last 3 Encounters:  01/19/22 113.4 kg  08/22/19 114.8 kg  02/12/17 112 kg    Examination:  Constitutional: NAD Eyes: no scleral icterus ENMT: Mucous membranes are dry.  Neck: normal, supple Respiratory: clear to auscultation bilaterally, no wheezing, no crackles. Normal respiratory effort. Cardiovascular: Regular rate and rhythm, no murmurs / rubs / gallops. No LE edema.  Abdomen: non distended, no  tenderness. Bowel sounds positive.  Musculoskeletal: no clubbing / cyanosis.  Skin: no rashes Neurologic: Grossly nonfocal   Data Reviewed: I have independently reviewed following labs and imaging studies   CBC Recent Labs  Lab 01/19/22 1225 01/20/22 0823  WBC 3.9* 2.4*  HGB 18.1* 16.6  HCT 51.9 49.0  PLT 185 170  MCV 82.3 82.9  MCH 28.7 28.1  MCHC 34.9 33.9  RDW 14.5 14.3  LYMPHSABS 0.5*  --   MONOABS 0.6  --   EOSABS 0.0  --   BASOSABS 0.0  --     Recent Labs  Lab 01/19/22 1225 01/20/22 0823  NA 135 135  K 3.6 3.0*  CL 103 103  CO2 22 21*  GLUCOSE 113* 99  BUN 15 12  CREATININE 1.67* 1.14  CALCIUM 8.8* 7.9*  AST  --  41  ALT  --  71*  ALKPHOS  --  47  BILITOT  --  0.7  ALBUMIN  --  3.6  BNP 62.5  --     ------------------------------------------------------------------------------------------------------------------ No results for input(s): "CHOL", "HDL", "LDLCALC", "TRIG", "CHOLHDL", "LDLDIRECT" in the last 72 hours.  No results found for: "HGBA1C" ------------------------------------------------------------------------------------------------------------------ No results for input(s): "TSH", "T4TOTAL", "T3FREE", "THYROIDAB" in the last 72 hours.  Invalid input(s): "FREET3"  Cardiac Enzymes No results for input(s): "CKMB", "TROPONINI", "MYOGLOBIN" in the last 168 hours.  Invalid input(s): "CK" ------------------------------------------------------------------------------------------------------------------    Component Value Date/Time   BNP 62.5 01/19/2022 1225    CBG: No results for input(s): "GLUCAP" in the last 168 hours.  Recent Results (from the past 240 hour(s))  C Difficile Quick Screen w PCR reflex     Status: None   Collection Time: 01/19/22  2:51 PM   Specimen: STOOL  Result Value Ref Range Status   C Diff antigen NEGATIVE NEGATIVE Final   C Diff toxin NEGATIVE NEGATIVE Final   C Diff interpretation No C. difficile detected.   Final    Comment: Performed at [facility], [place] [address] [place], [place] [address]  Gastrointestinal Panel by PCR , Stool     Status: Abnormal   Collection Time: 01/19/22  2:51 PM   Specimen: STOOL  Result Value Ref Range Status   Campylobacter species NOT DETECTED NOT DETECTED Final   Plesimonas shigelloides NOT DETECTED NOT DETECTED Final   Salmonella species NOT DETECTED NOT DETECTED Final   Yersinia enterocolitica NOT DETECTED NOT DETECTED Final   Vibrio species NOT DETECTED NOT DETECTED Final   Vibrio cholerae NOT DETECTED NOT DETECTED Final   Enteroaggregative E coli (EAEC) DETECTED (A) NOT DETECTED Final    Comment: RESULT CALLED TO, READ BACK BY AND VERIFIED WITH: Jacqulynn Cadet, RN [phone] 01/20/22 GM    Enteropathogenic E coli (EPEC) NOT DETECTED NOT DETECTED Final   Enterotoxigenic  E coli (ETEC) NOT DETECTED NOT DETECTED Final   Shiga like toxin producing E coli (STEC) NOT DETECTED NOT DETECTED Final   Shigella/Enteroinvasive E coli (EIEC) NOT DETECTED NOT DETECTED Final   Cryptosporidium NOT DETECTED NOT DETECTED Final   Cyclospora cayetanensis NOT DETECTED NOT DETECTED Final   Entamoeba histolytica NOT DETECTED NOT DETECTED Final   Giardia lamblia NOT DETECTED NOT DETECTED Final   Adenovirus F40/41 NOT DETECTED NOT DETECTED Final   Astrovirus NOT DETECTED NOT DETECTED Final   Norovirus GI/GII NOT DETECTED NOT DETECTED Final   Rotavirus A DETECTED (A) NOT DETECTED Final   Sapovirus (I, II, IV, and V) NOT DETECTED NOT DETECTED Final    Comment: Performed at [facility], [address] [place], [place] [address]     Radiology Studies: US RENAL  Result Date: 01/19/2022 CLINICAL DATA:  Acute kidney injury. EXAM: RENAL / URINARY TRACT ULTRASOUND COMPLETE COMPARISON:  None Available. FINDINGS: Right Kidney: Renal measurements: 11.9 cm x 6.2 cm x 4.9 cm = volume: 190.2 mL. Echogenicity within normal limits. No mass or hydronephrosis visualized. Left  Kidney: Renal measurements: 12.2 cm x 5.2 cm x 6.7 cm = volume: 223.5 mL. Echogenicity within normal limits. No mass or hydronephrosis visualized. Bladder: Appears normal for degree of bladder distention. Other: None. IMPRESSION: Normal renal ultrasound. Electronically Signed   By: Virgina Norfolk M.D.   On: 01/19/2022 19:34   DG Abd 1 View  Result Date: 01/19/2022 CLINICAL DATA:  Diarrhea, shortness of breath, and new onset atrial fibrillation. EXAM: ABDOMEN - 1 VIEW COMPARISON:  None Available. FINDINGS: Gas is present in nondilated colon and rectum, and there is a small amount of small bowel gas as well. No dilated loops of bowel are seen to suggest obstruction. Small calcifications in the pelvis on the right may reflect phleboliths. The visualized lung bases are clear. No acute osseous abnormality is identified. IMPRESSION: Nonobstructed bowel gas pattern. Electronically Signed   By: Logan Bores M.D.   On: 01/19/2022 16:19   DG Chest 2 View  Result Date: 01/19/2022 CLINICAL DATA:  Shortness of breath. EXAM: CHEST - 2 VIEW COMPARISON:  CT scan 09/27/2021 FINDINGS: The cardiac silhouette, mediastinal and hilar contours are within normal limits. The lungs are clear of an acute process. No pleural effusions or pulmonary lesions. The bony thorax is intact. IMPRESSION: No acute cardiopulmonary findings. Electronically Signed   By: Marijo Sanes M.D.   On: 01/19/2022 13:42     Marzetta Board, MD, PhD [facility]  Between 7 am - 7 pm I am available, please contact me via Amion (for emergencies) or Securechat (non urgent messages)  Between 7 pm - 7 am I am not available, please contact night coverage MD/APP via Amion

## 2022-01-21 DIAGNOSIS — R197 Diarrhea, unspecified: Secondary | ICD-10-CM | POA: Diagnosis not present

## 2022-01-21 DIAGNOSIS — N179 Acute kidney failure, unspecified: Secondary | ICD-10-CM | POA: Diagnosis not present

## 2022-01-21 DIAGNOSIS — I4891 Unspecified atrial fibrillation: Secondary | ICD-10-CM | POA: Diagnosis not present

## 2022-01-21 LAB — CBC
HCT: 45.3 % (ref 39.0–52.0)
Hemoglobin: 15.5 g/dL (ref 13.0–17.0)
MCH: 28.4 pg (ref 26.0–34.0)
MCHC: 34.2 g/dL (ref 30.0–36.0)
MCV: 83 fL (ref 80.0–100.0)
Platelets: UNDETERMINED 10*3/uL (ref 150–400)
RBC: 5.46 MIL/uL (ref 4.22–5.81)
RDW: 14.6 % (ref 11.5–15.5)
WBC: 2.8 10*3/uL — ABNORMAL LOW (ref 4.0–10.5)
nRBC: 0 % (ref 0.0–0.2)

## 2022-01-21 MED ORDER — APIXABAN 5 MG PO TABS: 5.0000 mg | ORAL_TABLET | Freq: Two times a day (BID) | ORAL | 1 refills | Status: DC

## 2022-01-21 MED ORDER — DILTIAZEM HCL ER COATED BEADS 180 MG PO CP24: 180.0000 mg | ORAL_CAPSULE | Freq: Every day | ORAL | 1 refills | Status: DC

## 2022-01-21 MED ORDER — CIPROFLOXACIN HCL 500 MG PO TABS: 500.0000 mg | ORAL_TABLET | Freq: Two times a day (BID) | ORAL | 0 refills | Status: AC

## 2022-01-21 NOTE — Discharge Summary (Signed)
Physician Discharge Summary  Calvin [name] GXQ:[id number] DOB: August 10, 1955 DOA: 01/19/2022  PCP: Calvin Dickens, MD  Admit date: 01/19/2022 Discharge date: 01/21/2022  Admitted From: home Disposition:  home  Recommendations for Outpatient Follow-up:  Follow up with PCP in 1-2 weeks Please obtain BMP/CBC in one week  Home Health: none Equipment/Devices: none  Discharge Condition: stable CODE STATUS: Full code Diet Orders (From admission, onward)     Start     Ordered   01/19/22 1803  Diet Heart Room service appropriate? Yes; Fluid consistency: Thin  Diet effective now       Question Answer Comment  Room service appropriate? Yes   Fluid consistency: Thin      01/19/22 1803            HPI: Per admitting MD, Calvin [name] is a 66 y.o. male with medical history significant of GERD, HTN, CAD. Presenting with diarrhea. Symptoms started 4 days ago. No fever. Mild generalized abdominal cramping. He started feeling weak and fatigued. So, he went to his jobsite physician. It was noted that he was in afib. He does not have a history of afib. He was given a couple liters of NS and started on flagyl/cipro for his diarrhea. He was sent home. Yesterday evening he drank a lot of fluids, but still felt weak and had some shortness of breath. So today when he returned to the [facility], he was still found to be in afib. It was recommended that he come to the ED for evaluation. He denies any other aggravating or alleviating factors.   Hospital Course / Discharge diagnoses: Active Problems:   New onset atrial fibrillation (HCC)   HTN (hypertension)   GERD (gastroesophageal reflux disease)   CAD (coronary artery disease)   AKI (acute kidney injury) ([place])   Dehydration   Diarrhea   Principal problem Diarrheal illness due to E coli and rotavirus-possibly related to food poisoning.  C. difficile was negative.  GI pathogen panel did reveal enteroaggregative  E. coli as well as  rotavirus.  Due to severity has been placed on ciprofloxacin.  He improved with antibiotics, his diarrhea is resolving and his stools are more formed.  He has no nausea, vomiting and able to tolerate oral intake.  He will be discharged home in stable condition with 2 additional days of ciprofloxacin to complete a 3-day course.   Active problems New onset A-fib with RVR-he has been placed on diltiazem drip as well as heparin.  Cardiology consulted, appreciate input.  Underwent a 2D echo which is fairly unremarkable.  Remains 8 controlled in A-fib.  He was placed on anticoagulation with Eliquis.  His diltiazem has been switched to oral, and currently is rate controlled.  He will have follow-up with cardiology in about 3 weeks to be considered for cardioversion if he remains in A-fib.  Acute kidney injury-creatinine on admission 1.67, has received IV fluids and normalized Hypokalemia-potassium has been repleted.  His diarrhea is now resolved and he is tolerating regular intake.  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   apixaban 5 MG Tabs tablet Commonly known as: ELIQUIS Take 1 tablet (5 mg total) by mouth 2 (two) times daily.   cholecalciferol 25 MCG (1000 UNIT) tablet Commonly known as: VITAMIN D3 Take 1,000 Units by mouth every morning.   ciprofloxacin 500 MG tablet Commonly known as: CIPRO Take 1 tablet (500 mg total) by mouth 2 (two) times daily for 2 days. What changed: when to take this    diltiazem 180 MG 24 hr capsule Commonly known as: CARDIZEM CD Take 1 capsule (180 mg total) by mouth daily.   hydrochlorothiazide 25 MG tablet Commonly known as: HYDRODIURIL Take 25 mg by mouth every morning.   montelukast 10 MG tablet Commonly known as: SINGULAIR Take 10 mg by mouth daily as needed (seasonal allergies).   multivitamin with minerals tablet Take 1 tablet by mouth every morning.   omeprazole 20 MG capsule Commonly known as: PRILOSEC Take 20 mg by mouth every morning.   Voltaren 1 % Gel Generic drug: diclofenac Sodium Apply 1 Application topically daily as needed (joint pain).        Follow-up Information     Calvin Sciara, NP Follow up on 01/27/2022.   Specialties: Nurse Practitioner, Family Medicine Why: '@1'$ :30pm for hospital follow up. Please arrive 15 minutes early Contact information: [address] [place] [address] [phone]                 Consultations: Cardiology  Procedures/Studies:  ECHOCARDIOGRAM COMPLETE  Result Date: 01/20/2022    ECHOCARDIOGRAM REPORT   Patient Name:   Calvin [name] Date of Exam: 01/20/2022 Medical Rec #:  [id number]       Height:       73.0 in Accession #:    [id number]      Weight:       250.0 lb Date of Birth:  Nov 09, 1955        BSA:          2.366 m Patient Age:    37 years        BP:           133/91 mmHg Patient Gender: M               HR:           73 bpm. Exam Location:  Inpatient Procedure: 2D Echo, Cardiac Doppler and Color Doppler Indications:    Atrial Fibrillation  History:        Patient has prior history of Echocardiogram examinations, most                 recent 08/12/2019. CAD, Arrythmias:Atrial Fibrillation; Risk                 Factors:Hypertension.  Sonographer:    Wenda Low Referring Phys: [id number] Calvin [name]  1. Left ventricular ejection fraction, by estimation, is 55 to 60%. The left ventricle has normal function. The left ventricle has no regional wall  motion abnormalities. Left ventricular diastolic parameters are indeterminate.  2. Right ventricular systolic function is normal. The right ventricular size is normal. Tricuspid regurgitation signal is inadequate for assessing PA pressure.  3. Left atrial size was mildly dilated.  4. Right atrial size was mildly dilated.  5. The mitral valve is normal in structure. No evidence of mitral valve  regurgitation. No evidence of mitral stenosis.  6. The aortic valve is tricuspid. Aortic valve regurgitation is not visualized. Aortic valve sclerosis is present, with no evidence of aortic valve stenosis.  7. The inferior vena cava is normal in size with greater than 50% respiratory variability, suggesting right atrial pressure of 3 mmHg. FINDINGS  Left Ventricle: Left ventricular ejection fraction, by estimation, is 55 to 60%. The left ventricle has normal function. The left ventricle has no regional wall motion abnormalities. The left ventricular internal cavity size was normal in size. There is  no left ventricular hypertrophy. Left ventricular diastolic parameters are indeterminate. Right Ventricle: The right ventricular size is normal. No increase in right ventricular wall thickness. Right ventricular systolic function is normal. Tricuspid regurgitation signal is inadequate for assessing PA pressure. The tricuspid regurgitant velocity is 1.54 m/s, and with an assumed right atrial pressure of 3 mmHg, the estimated right ventricular systolic pressure is 26.7 mmHg. Left Atrium: Left atrial size was mildly dilated. Right Atrium: Right atrial size was mildly dilated. Pericardium: There is no evidence of pericardial effusion. Mitral Valve: The mitral valve is normal in structure. No evidence of mitral valve regurgitation. No evidence of mitral valve stenosis. MV peak gradient, 4.8 mmHg. The mean mitral valve gradient is 1.0 mmHg. Tricuspid Valve: The tricuspid valve is normal in structure. Tricuspid valve regurgitation is  trivial. Aortic Valve: The aortic valve is tricuspid. Aortic valve regurgitation is not visualized. Aortic valve sclerosis is present, with no evidence of aortic valve stenosis. Aortic valve mean gradient measures 3.3 mmHg. Aortic valve peak gradient measures 5.1  mmHg. Aortic valve area, by VTI measures 2.23 cm. Pulmonic Valve: The pulmonic valve was grossly normal. Pulmonic valve regurgitation is not visualized. Aorta: The aortic root and ascending aorta are structurally normal, with no evidence of dilitation. Venous: The inferior vena cava is normal in size with greater than 50% respiratory variability, suggesting right atrial pressure of 3 mmHg. IAS/Shunts: The interatrial septum was not well visualized.  LEFT VENTRICLE PLAX 2D LVIDd:         5.40 cm LVIDs:         3.40 cm LV PW:         0.80 cm LV IVS:        0.90 cm LVOT diam:     2.00 cm LV SV:         52 LV SV Index:   22 LVOT Area:     3.14 cm  RIGHT VENTRICLE RV Basal diam:  3.95 cm RV Mid diam:    3.20 cm RV S prime:     9.68 cm/s TAPSE (M-mode): 2.1 cm LEFT ATRIUM              Index        RIGHT ATRIUM           Index LA diam:        4.00 cm  1.69 cm/m   RA Area:     24.60 cm LA Vol (A2C):   101.0 ml 42.69 ml/m  RA Volume:   80.70 ml  34.11 ml/m LA Vol (A4C):   86.1 ml  36.40 ml/m LA Biplane Vol: 94.6 ml  39.99 ml/m  AORTIC VALVE                    PULMONIC VALVE AV Area (Vmax):    2.29 cm     PV Vmax:       0.75 m/s AV Area (  Vmean):   2.12 cm     PV Peak grad:  2.2 mmHg AV Area (VTI):     2.23 cm AV Vmax:           113.00 cm/s AV Vmean:          81.667 cm/s AV VTI:            0.234 m AV Peak Grad:      5.1 mmHg AV Mean Grad:      3.3 mmHg LVOT Vmax:         82.23 cm/s LVOT Vmean:        55.233 cm/s LVOT VTI:          0.166 m LVOT/AV VTI ratio: 0.71  AORTA Ao Root diam: 3.60 cm Ao Asc diam:  3.70 cm MITRAL VALVE                TRICUSPID VALVE MV Area (PHT): 4.60 cm     TR Peak grad:   9.5 mmHg MV Area VTI:   1.83 cm     TR Vmax:         154.00 cm/s MV Peak grad:  4.8 mmHg MV Mean grad:  1.0 mmHg     SHUNTS MV Vmax:       1.09 m/s     Systemic VTI:  0.17 m MV Vmean:      36.5 cm/s    Systemic Diam: 2.00 cm MV Decel Time: 165 msec MV E velocity: 105.00 cm/s Oswaldo Milian MD Electronically signed by Oswaldo Milian MD Signature Date/Time: 01/20/2022/4:45:49 PM    Final    US RENAL  Result Date: 01/19/2022 CLINICAL DATA:  Acute kidney injury. EXAM: RENAL / URINARY TRACT ULTRASOUND COMPLETE COMPARISON:  None Available. FINDINGS: Right Kidney: Renal measurements: 11.9 cm x 6.2 cm x 4.9 cm = volume: 190.2 mL. Echogenicity within normal limits. No mass or hydronephrosis visualized. Left Kidney: Renal measurements: 12.2 cm x 5.2 cm x 6.7 cm = volume: 223.5 mL. Echogenicity within normal limits. No mass or hydronephrosis visualized. Bladder: Appears normal for degree of bladder distention. Other: None. IMPRESSION: Normal renal ultrasound. Electronically Signed   By: Virgina Norfolk M.D.   On: 01/19/2022 19:34   DG Abd 1 View  Result Date: 01/19/2022 CLINICAL DATA:  Diarrhea, shortness of breath, and new onset atrial fibrillation. EXAM: ABDOMEN - 1 VIEW COMPARISON:  None Available. FINDINGS: Gas is present in nondilated colon and rectum, and there is a small amount of small bowel gas as well. No dilated loops of bowel are seen to suggest obstruction. Small calcifications in the pelvis on the right may reflect phleboliths. The visualized lung bases are clear. No acute osseous abnormality is identified. IMPRESSION: Nonobstructed bowel gas pattern. Electronically Signed   By: Logan Bores M.D.   On: 01/19/2022 16:19   DG Chest 2 View  Result Date: 01/19/2022 CLINICAL DATA:  Shortness of breath. EXAM: CHEST - 2 VIEW COMPARISON:  CT scan 09/27/2021 FINDINGS: The cardiac silhouette, mediastinal and hilar contours are within normal limits. The lungs are clear of an acute process. No pleural effusions or pulmonary lesions. The bony thorax is  intact. IMPRESSION: No acute cardiopulmonary findings. Electronically Signed   By: Marijo Sanes M.D.   On: 01/19/2022 13:42     Subjective: - no chest pain, shortness of breath, no abdominal pain, nausea or vomiting.   Discharge Exam: BP 134/83 (BP Location: Left Arm)   Pulse 85   Temp 98.4 F (36.9  C) (Oral)   Resp 16   Ht '6\' 1"'$  (1.854 m)   Wt 113.4 kg   SpO2 96%   BMI 32.98 kg/m   General: Pt is alert, awake, not in acute distress Cardiovascular: irregular Respiratory: CTA bilaterally, no wheezing, no rhonchi Abdominal: Soft, NT, ND, bowel sounds + Extremities: no edema, no cyanosis   The results of significant diagnostics from this hospitalization (including imaging, microbiology, ancillary and laboratory) are listed below for reference.     Microbiology: Recent Results (from the past 240 hour(s))  C Difficile Quick Screen w PCR reflex     Status: None   Collection Time: 01/19/22  2:51 PM   Specimen: STOOL  Result Value Ref Range Status   C Diff antigen NEGATIVE NEGATIVE Final   C Diff toxin NEGATIVE NEGATIVE Final   C Diff interpretation No C. difficile detected.  Final    Comment: Performed at [facility], [place] [address] [place], [place] [address]  Gastrointestinal Panel by PCR , Stool     Status: Abnormal   Collection Time: 01/19/22  2:51 PM   Specimen: STOOL  Result Value Ref Range Status   Campylobacter species NOT DETECTED NOT DETECTED Final   Plesimonas shigelloides NOT DETECTED NOT DETECTED Final   Salmonella species NOT DETECTED NOT DETECTED Final   Yersinia enterocolitica NOT DETECTED NOT DETECTED Final   Vibrio species NOT DETECTED NOT DETECTED Final   Vibrio cholerae NOT DETECTED NOT DETECTED Final   Enteroaggregative E coli (EAEC) DETECTED (A) NOT DETECTED Final    Comment: RESULT CALLED TO, READ BACK BY AND VERIFIED WITH: Jacqulynn Cadet, RN [phone] 01/20/22 GM    Enteropathogenic E coli (EPEC) NOT DETECTED NOT DETECTED Final    Enterotoxigenic E coli (ETEC) NOT DETECTED NOT DETECTED Final   Shiga like toxin producing E coli (STEC) NOT DETECTED NOT DETECTED Final   Shigella/Enteroinvasive E coli (EIEC) NOT DETECTED NOT DETECTED Final   Cryptosporidium NOT DETECTED NOT DETECTED Final   Cyclospora cayetanensis NOT DETECTED NOT DETECTED Final   Entamoeba histolytica NOT DETECTED NOT DETECTED Final   Giardia lamblia NOT DETECTED NOT DETECTED Final   Adenovirus F40/41 NOT DETECTED NOT DETECTED Final   Astrovirus NOT DETECTED NOT DETECTED Final   Norovirus GI/GII NOT DETECTED NOT DETECTED Final   Rotavirus A DETECTED (A) NOT DETECTED Final   Sapovirus (I, II, IV, and V) NOT DETECTED NOT DETECTED Final    Comment: Performed at [facility], [address] [place], [place] [address]     Labs: Basic Metabolic Panel: Recent Labs  Lab 01/19/22 1225 01/20/22 0823  NA 135 135  K 3.6 3.0*  CL 103 103  CO2 22 21*  GLUCOSE 113* 99  BUN 15 12  CREATININE 1.67* 1.14  CALCIUM 8.8* 7.9*   Liver Function Tests: Recent Labs  Lab 01/20/22 0823  AST 41  ALT 71*  ALKPHOS 47  BILITOT 0.7  PROT 6.7  ALBUMIN 3.6   CBC: Recent Labs  Lab 01/19/22 1225 01/20/22 0823 01/21/22 0439  WBC 3.9* 2.4* 2.8*  NEUTROABS 2.9  --   --   HGB 18.1* 16.6 15.5  HCT 51.9 49.0 45.3  MCV 82.3 82.9 83.0  PLT 185 170 PLATELET CLUMPS NOTED ON SMEAR, UNABLE TO ESTIMATE   CBG: No results for input(s): "GLUCAP" in the last 168 hours. Hgb A1c No results for input(s): "HGBA1C" in the last 72 hours. Lipid Profile No results for input(s): "CHOL", "HDL", "LDLCALC", "TRIG", "CHOLHDL", "LDLDIRECT" in  the last 72 hours. Thyroid function studies Recent Labs    01/20/22 1037  TSH 1.615   Urinalysis No results found for: "COLORURINE", "APPEARANCEUR", "LABSPEC", "PHURINE", "GLUCOSEU", "HGBUR", "BILIRUBINUR", "KETONESUR", "PROTEINUR", "UROBILINOGEN", "NITRITE", "LEUKOCYTESUR"  FURTHER DISCHARGE INSTRUCTIONS:   Get Medicines  reviewed and adjusted: Please take all your medications with you for your next visit with your Primary MD   Laboratory/radiological data: Please request your Primary MD to go over all hospital tests and procedure/radiological results at the follow up, please ask your Primary MD to get all Hospital records sent to his/her office.   In some cases, they will be blood work, cultures and biopsy results pending at the time of your discharge. Please request that your primary care M.D. goes through all the records of your hospital data and follows up on these results.   Also Note the following: If you experience worsening of your admission symptoms, develop shortness of breath, life threatening emergency, suicidal or homicidal thoughts you must seek medical attention immediately by calling 911 or calling your MD immediately  if symptoms less severe.   You must read complete instructions/literature along with all the possible adverse reactions/side effects for all the Medicines you take and that have been prescribed to you. Take any new Medicines after you have completely understood and accpet all the possible adverse reactions/side effects.    Do not drive when taking Pain medications or sleeping medications (Benzodaizepines)   Do not take more than prescribed Pain, Sleep and Anxiety Medications. It is not advisable to combine anxiety,sleep and pain medications without talking with your primary care practitioner   Special Instructions: If you have smoked or chewed Tobacco  in the last 2 yrs please stop smoking, stop any regular Alcohol  and or any Recreational drug use.   Wear Seat belts while driving.   Please note: You were cared for by a hospitalist during your hospital stay. Once you are discharged, your primary care physician will handle any further medical issues. Please note that NO REFILLS for any discharge medications will be authorized once you are discharged, as it is imperative that you  return to your primary care physician (or establish a relationship with a primary care physician if you do not have one) for your post hospital discharge needs so that they can reassess your need for medications and monitor your lab values.  Time coordinating discharge: 35 minutes  SIGNED:  Marzetta Board, MD, PhD 01/21/2022, 8:13 AM

## 2022-01-21 NOTE — Progress Notes (Signed)
Patient was discharged with belongings into his daughters care. Ambulated to main entrance for discharge.

## 2022-01-21 NOTE — TOC Transition Note (Signed)
Transition of Care [facility]) - CM/SW Discharge Note   Patient Details  Name: Fareed Fung MRN: [id number] Date of Birth: [birth year]  [facility] CM/SW Contact:  Leeroy Cha, RN Phone Number: 01/21/2022, 9:58 AM   Clinical Narrative:    [phone] chart reviewed. Plan is to return home with self-care at this time.   Final next level of care: Home/Self Care Barriers to Discharge: Barriers Resolved   Patient Goals and CMS Choice Patient states their goals for this hospitalization and ongoing recovery are:: to go home CMS Medicare.gov Compare Post Acute Care list provided to:: Patient    Discharge Placement                       Discharge Plan and Services   Discharge Planning Services: CM Consult                                 Social Determinants of Health (SDOH) Interventions     Readmission Risk Interventions     No data to display

## 2022-01-25 NOTE — H&P (View-Only) (Signed)
Cardiology Office Note:    Date:  01/27/2022   ID:  Calvin [name], DOB 1955-09-26, MRN [id number]  PCP:  Waldemar Dickens, MD   [facility] Providers Cardiologist:  Sanda Klein, MD     Referring MD: Waldemar Dickens, MD   Chief Complaint: hospital follow-up  History of Present Illness:    Calvin [name] is a very pleasant 65 y.o. male with a hx of hypertension, GERD, recently diagnosed atrial fibrillation.  He had normal ETT and echo in 2021 ordered by PCP for routine cardiac evaluation.  Calcium score of 0 on CT cardiac scoring 09/2021.  He presented to the ED on 01/19/2022 following multiple episodes of nonbloody diarrhea, low appetite and fatigue.  Due to ongoing symptoms he was seen by PCP the day prior and diagnosed with severe dehydration and given IV fluid in clinic.  He was also started on an antibiotic. He was found to be in atrial fibrillation on exam and EKG.  He was sent home and advised to drink fluids.  He continued to drink Gatorade.  On 7/6 he went to PCP again and remained in atrial fibrillation.  He was advised to come to ER for further evaluation. Upon arrival, HR in the 120s.  He was admitted for A-fib RVR and AKI with serum creatinine of 1.67. Started on IV diltiazem. Chest and abdominal x-ray without acute findings. Renal ultrasound reassuring.  CHA2DS2-VASc score of 1 for hypertension.  Per Dr. Sallyanne Kuster, preliminary review of the echocardiogram shows normal left and right ventricular systolic function.  However both the right and left atria are mildly dilated.  This suggests some chronicity to the rhythm abnormality.  There are no serious valvular abnormalities, regional wall motion abnormalities or pericardial disease.  Compared the images side-by-side with the study from 2021 and indeed just 2 years ago left and right atrial sizes appeared smaller, in normal range which were rather surprising. Dr. Sallyanne Kuster felt possible that he has a high burden of atrial  fibrillation then we realize due to atrial myopathy.  A less likely explanation would be that he is in the early stages of a restrictive cardiomyopathy such as cardiac amyloidosis. Discussion of anticoagulation ensued as he will gain a point in 6 months when he turns 67. He was started on Eliqius 5 mg twice daily and continued on diltiazem 180 mg daily.   Today, he is alone for follow-up. Reports he finally feels like the GI issues have resolved.  Feels an occasional "tic" in the chest that is not bothersome.  Had a flushed feeling initially after starting diltiazem, was concerned that BP was elevated.  Flushing has since resolved and he feels it may have been related to transition in antihypertensives. No bleeding concerns since starting Eliquis, no missed doses.  Reports his family is very concerned about recent diagnosis.  We discussed some information to share with them to hopefully calm them. He denies chest pain, shortness of breath, lower extremity edema, fatigue, melena, hematuria, hemoptysis, diaphoresis, weakness, presyncope, syncope, orthopnea, and PND.  Past Medical History:  Diagnosis Date   Allergy    Family history of early CAD    GERD (gastroesophageal reflux disease)    Hypertension    Nasal turbinate hypertrophy     Past Surgical History:  Procedure Laterality Date   ANKLE ARTHRODESIS  1980   bone spur-right   breast cyst removal Right    COLONOSCOPY     CYST EXCISION  1979   right chest  NASAL SEPTOPLASTY W/ TURBINOPLASTY Bilateral 08/22/2019   Procedure: NASAL SEPTOPLASTY WITH TURBINATE REDUCTION;  Surgeon: Jodi Marble, MD;  Location: [facility];  Service: ENT;  Laterality: Bilateral;   Testicular Cyst Right     Current Medications: Current Meds  Medication Sig   acetaminophen (TYLENOL) 500 MG tablet Take 1,000-1,500 mg by mouth 2 (two) times daily as needed for fever.   cholecalciferol (VITAMIN D3) 25 MCG (1000 UNIT) tablet Take 1,000 Units by mouth  every morning.   diclofenac Sodium (VOLTAREN) 1 % GEL Apply 1 Application topically daily as needed (joint pain).   hydrochlorothiazide (HYDRODIURIL) 25 MG tablet Take 25 mg by mouth every morning.   montelukast (SINGULAIR) 10 MG tablet Take 10 mg by mouth daily as needed (seasonal allergies).   Multiple Vitamins-Minerals (MULTIVITAMIN WITH MINERALS) tablet Take 1 tablet by mouth every morning.   omeprazole (PRILOSEC) 20 MG capsule Take 20 mg by mouth every morning.   [DISCONTINUED] apixaban (ELIQUIS) 5 MG TABS tablet Take 1 tablet (5 mg total) by mouth 2 (two) times daily.   [DISCONTINUED] diltiazem (CARDIZEM CD) 180 MG 24 hr capsule Take 1 capsule (180 mg total) by mouth daily.     Allergies:   Codeine   Social History   Socioeconomic History   Marital status: Married    Spouse name: Not on file   Number of children: Not on file   Years of education: Not on file   Highest education level: Not on file  Occupational History   Not on file  Tobacco Use   Smoking status: Never   Smokeless tobacco: Never  Vaping Use   Vaping Use: Never used  Substance and Sexual Activity   Alcohol use: Yes    Comment: occ   Drug use: No   Sexual activity: Not on file  Other Topics Concern   Not on file  Social History Narrative   Not on file   Social Determinants of Health   Financial Resource Strain: Not on file  Food Insecurity: Not on file  Transportation Needs: Not on file  Physical Activity: Not on file  Stress: Not on file  Social Connections: Not on file     Family History: The patient's family history includes Esophageal cancer in his father. There is no history of Colon cancer, Rectal cancer, Stomach cancer, or Pancreatic cancer.  ROS:   Please see the history of present illness.  All other systems reviewed and are negative.  Labs/Other Studies Reviewed:    The following studies were reviewed today:  Echo 01/20/22  1. Left ventricular ejection fraction, by estimation, is  55 to 60%. The  left ventricle has normal function. The left ventricle has no regional  wall motion abnormalities. Left ventricular diastolic parameters are  indeterminate.   2. Right ventricular systolic function is normal. The right ventricular  size is normal. Tricuspid regurgitation signal is inadequate for assessing  PA pressure.   3. Left atrial size was mildly dilated.   4. Right atrial size was mildly dilated.   5. The mitral valve is normal in structure. No evidence of mitral valve  regurgitation. No evidence of mitral stenosis.   6. The aortic valve is tricuspid. Aortic valve regurgitation is not  visualized. Aortic valve sclerosis is present, with no evidence of aortic  valve stenosis.   7. The inferior vena cava is normal in size with greater than 50%  respiratory variability, suggesting right atrial pressure of 3 mmHg.   ETT 08/20/19  Excellent exercise capacity (13.2 METS) Upsloping ST segment depression during stress that likely represents normal J point depression with exercise. No evidence of ischemia  CT Cardiac Scoring 09/27/21  IMPRESSION: 1. Coronary calcium score is 0. 2. Hepatic cysts. 3. 2 mm right lung nodule. No follow-up needed if patient is low-risk. Non-contrast chest CT can be considered in 12 months if patient is high-risk. This recommendation follows the consensus statement: Guidelines for Management of Incidental Pulmonary Nodules Detected on CT Images: From the Fleischner Society 2017; Radiology 2017; 284:228-243.  Echo 08/12/19  1. Left ventricular ejection fraction, by visual estimation, is 60 to  65%. The left ventricle has normal function. There is no left ventricular  hypertrophy.   2. The left ventricle has no regional wall motion abnormalities.   3. Global right ventricle has normal systolic function.The right  ventricular size is normal. No increase in right ventricular wall  thickness.   4. Left atrial size was normal.   5. Right  atrial size was normal.   6. The mitral valve is normal in structure. Trivial mitral valve  regurgitation. No evidence of mitral stenosis.   7. The tricuspid valve is grossly normal.   8. The tricuspid valve is grossly normal. Tricuspid valve regurgitation  is not demonstrated.   9. The aortic valve is normal in structure. Aortic valve regurgitation is  not visualized. No evidence of aortic valve sclerosis or stenosis.  10. The pulmonic valve was grossly normal. Pulmonic valve regurgitation is  not visualized.  11. The atrial septum is grossly normal.    Recent Labs: 01/19/2022: B Natriuretic Peptide 62.5 01/20/2022: ALT 71; BUN 12; Creatinine, Ser 1.14; Potassium 3.0; Sodium 135; TSH 1.615 01/21/2022: Hemoglobin 15.5; Platelets PLATELET CLUMPS NOTED ON SMEAR, UNABLE TO ESTIMATE  Recent Lipid Panel No results found for: "CHOL", "TRIG", "HDL", "CHOLHDL", "VLDL", "LDLCALC", "LDLDIRECT"   Risk Assessment/Calculations:    CHA2DS2-VASc Score = 1  This indicates a 0.6% annual risk of stroke. The patient's score is based upon: CHF History: 0 HTN History: 1 Diabetes History: 0 Stroke History: 0 Vascular Disease History: 0 Age Score: 0 Gender Score: 0       Physical Exam:    VS:  BP 122/70   Pulse 87   Ht '6\' 1"'$  (1.854 m)   Wt 246 lb 12.8 oz (111.9 kg)   SpO2 96%   BMI 32.56 kg/m     Wt Readings from Last 3 Encounters:  01/27/22 246 lb 12.8 oz (111.9 kg)  01/19/22 250 lb (113.4 kg)  08/22/19 253 lb 1.4 oz (114.8 kg)     GEN:  Well nourished, well developed in no acute distress HEENT: Normal NECK: No JVD; No carotid bruits CARDIAC: Irregular RR, no murmurs, rubs, gallops RESPIRATORY:  Clear to auscultation without rales, wheezing or rhonchi  ABDOMEN: Soft, non-tender, non-distended MUSCULOSKELETAL:  Mild bilateral LE edema; No deformity. 2+ pedal pulses, equal bilaterally SKIN: Warm and dry NEUROLOGIC:  Alert and oriented x 3 PSYCHIATRIC:  Normal affect   EKG:  EKG is  ordered today.  The ekg ordered today demonstrates atrial fibrillation with ventricular rate 87 bpm, no acute change from previous tracing  Diagnoses:    1. Persistent atrial fibrillation ([place])   2. Chronic anticoagulation   3. Essential hypertension   4. Family history of early CAD    Assessment and Plan:     Persistent atrial fibrillation on chronic anticoagulation: He remains in atrial fibrillation today. HR well controlled at 87 bpm. He is  feeling well on diltiazem.  No bleeding problems on Eliquis, no missed doses. We discussed various treatment options for atrial fibrillation. Mild biatrial enlargement on echo 7/7 in comparison to echo from 2021. DCCV recommended by Dr. Sallyanne Kuster at time of hospitalization. Risks of cardioversion discussed and he would like to proceed. He will need to continue anticoagulation for 2 more weeks. He is aware he will have to continue anticoagulation for at least 4 weeks following cardioversion. Will plan for EKG 1-2 days prior to DCCV to ensure that he remains in a fib. Will get pre-procedure lab work at that time.   Hypertension: BP is well-controlled. He is feeling well since switching to diltiazem. No changes today.   Family history of early CAD: Calcium score of zero on 09/27/21. Normal ETT and echo in 2021.  We do not have cholesterol screening to review. We will check fasting lipids at upcoming lab appointment for preprocedure labs.  Encouraged regular exercise and heart healthy, mostly plant-based diet   Shared Decision Making/Informed Consent The risks (stroke, cardiac arrhythmias rarely resulting in the need for a temporary or permanent pacemaker, skin irritation or burns and complications associated with conscious sedation including aspiration, arrhythmia, respiratory failure and death), benefits (restoration of normal sinus rhythm) and alternatives of a direct current cardioversion were explained in detail to Mr. Westenberger and he agrees to proceed.       Medication Adjustments/Labs and Tests Ordered: Current medicines are reviewed at length with the patient today.  Concerns regarding medicines are outlined above.  Orders Placed This Encounter  Procedures   CBC   Basic Metabolic Panel (BMET)   Lipid Profile   EKG 12-Lead   Meds ordered this encounter  Medications   apixaban (ELIQUIS) 5 MG TABS tablet    Sig: Take 1 tablet (5 mg total) by mouth 2 (two) times daily.    Dispense:  180 tablet    Refill:  3   diltiazem (CARDIZEM CD) 180 MG 24 hr capsule    Sig: Take 1 capsule (180 mg total) by mouth daily.    Dispense:  90 capsule    Refill:  3    Patient Instructions  Medication Instructions:   Your physician recommends that you continue on your current medications as directed. Please refer to the Current Medication list given to you today.   *If you need a refill on your cardiac medications before your next appointment, please call your pharmacy*   Lab Work:  Your physician recommends that you return for lab work on Monday, July 31 between 7:30-4:30.    If you have labs (blood work) drawn today and your tests are completely normal, you will receive your results only by: [place] (if you have MyChart) OR A paper copy in the mail If you have any lab test that is abnormal or we need to change your treatment, we will call you to review the results.   Testing/Procedures:  Dear Mr. Paolo, You are scheduled for a TEE/Cardioversion/TEE Cardioversion on Wednesday, August 2 with Dr. Gardiner Rhyme.  Please arrive at the [facility] (Main Entrance A) at [facility]: [address] [place], [place] [address] at 11:30 am. (1 hour prior to procedure unless lab work is needed; if lab work is needed arrive 1.5 hours ahead)  DIET: Nothing to eat or drink after midnight except a sip of water with medications (see medication instructions below)  FYI: For your safety, and to allow [place] to monitor your vital signs accurately  during the surgery/procedure we request that   if you have artificial nails, gel coating, SNS etc. Please have those removed prior to your surgery/procedure. Not having the nail coverings /polish removed may result in cancellation or delay of your surgery/procedure.   Medication Instructions: Hold HCTZ the am of cardioversion  Continue your anticoagulant: Eliquis You will need to continue your anticoagulant after your procedure until you  are told by your  Provider that it is safe to stop   Come to: Monday, July 31 (Lab option #1) Come to the lab at [facility] between the hours of 8:00 am and 4:30 pm. You do have to be fasting.  You must have a responsible person to drive you home and stay in the waiting area during your procedure. Failure to do so could result in cancellation.  Bring your insurance cards.  *Special Note: Every effort is made to have your procedure done on time. Occasionally there are emergencies that occur at the hospital that may cause delays. Please be patient if a delay does occur.     Follow-Up: At [facility], you and your health needs are our priority.  As part of our continuing mission to provide you with exceptional heart care, we have created designated Provider Care Teams.  These Care Teams include your primary Cardiologist (physician) and Advanced Practice Providers (APPs -  Physician Assistants and Nurse Practitioners) who all work together to provide you with the care you need, when you need it.  We recommend signing up for the patient portal called "MyChart".  Sign up information is provided on this After Visit Summary.  MyChart is used to connect with patients for Virtual Visits (Telemedicine).  Patients are able to view lab/test results, encounter notes, upcoming appointments, etc.  Non-urgent messages can be sent to your provider as well.   To learn more about what you can do with MyChart, go to [email or web address].    Your next  appointment:   4 week(s)  The format for your next appointment:   In Person  Provider:   Sanda Klein, MD     Other Instructions   You will come in on Monday, July 31 at 11:00 am for nurse visit to get EKG.  Mediterranean Diet A Mediterranean diet refers to food and lifestyle choices that are based on the traditions of countries located on the [employer]. It focuses on eating more fruits, vegetables, whole grains, beans, nuts, seeds, and heart-healthy fats, and eating less dairy, meat, eggs, and processed foods with added sugar, salt, and fat. This way of eating has been shown to help prevent certain conditions and improve outcomes for people who have chronic diseases, like kidney disease and heart disease. What are tips for following this plan? Reading food labels Check the serving size of packaged foods. For foods such as rice and pasta, the serving size refers to the amount of cooked product, not dry. Check the total fat in packaged foods. Avoid foods that have saturated fat or trans fats. Check the ingredient list for added sugars, such as corn syrup. Shopping  Buy a variety of foods that offer a balanced diet, including: Fresh fruits and vegetables (produce). Grains, beans, nuts, and seeds. Some of these may be available in unpackaged forms or large amounts (in bulk). Fresh seafood. Poultry and eggs. Low-fat dairy products. Buy whole ingredients instead of prepackaged foods. Buy fresh fruits and vegetables in-season from local farmers markets. Buy plain frozen fruits and vegetables. If you do  not have access to quality fresh seafood, buy precooked frozen shrimp or canned fish, such as tuna, salmon, or sardines. Stock your pantry so you always have certain foods on hand, such as olive oil, canned tuna, canned tomatoes, rice, pasta, and beans. Cooking Cook foods with extra-virgin olive oil instead of using butter or other vegetable oils. Have meat as a side dish, and  have vegetables or grains as your main dish. This means having meat in small portions or adding small amounts of meat to foods like pasta or stew. Use beans or vegetables instead of meat in common dishes like chili or lasagna. Experiment with different cooking methods. Try roasting, broiling, steaming, and sauting vegetables. Add frozen vegetables to soups, stews, pasta, or rice. Add nuts or seeds for added healthy fats and plant protein at each meal. You can add these to yogurt, salads, or vegetable dishes. Marinate fish or vegetables using olive oil, lemon juice, garlic, and fresh herbs. Meal planning Plan to eat one vegetarian meal one day each week. Try to work up to two vegetarian meals, if possible. Eat seafood two or more times a week. Have healthy snacks readily available, such as: Vegetable sticks with hummus. Greek yogurt. Fruit and nut trail mix. Eat balanced meals throughout the week. This includes: Fruit: 2-3 servings a day. Vegetables: 4-5 servings a day. Low-fat dairy: 2 servings a day. Fish, poultry, or lean meat: 1 serving a day. Beans and legumes: 2 or more servings a week. Nuts and seeds: 1-2 servings a day. Whole grains: 6-8 servings a day. Extra-virgin olive oil: 3-4 servings a day. Limit red meat and sweets to only a few servings a month. Lifestyle  Cook and eat meals together with your family, when possible. Drink enough fluid to keep your urine pale yellow. Be physically active every day. This includes: Aerobic exercise like running or swimming. Leisure activities like gardening, walking, or housework. Get 7-8 hours of sleep each night. If recommended by your health care provider, drink red wine in moderation. This means 1 glass a day for nonpregnant women and 2 glasses a day for men. A glass of wine equals 5 oz (150 mL). What foods should I eat? Fruits Apples. Apricots. Avocado. Berries. Bananas. Cherries. Dates. Figs. Grapes. Lemons. Melon. Oranges.  Peaches. Plums. Pomegranate. Vegetables Artichokes. Beets. Broccoli. Cabbage. Carrots. Eggplant. Green beans. Chard. Kale. Spinach. Onions. Leeks. Peas. Squash. Tomatoes. Peppers. Radishes. Grains Whole-grain pasta. Brown rice. Bulgur wheat. Polenta. Couscous. Whole-wheat bread. [place]. Meats and other proteins Beans. Almonds. Sunflower seeds. Pine nuts. Peanuts. [place]. Salmon. Scallops. Shrimp. [place]. Tilapia. Clams. Oysters. Eggs. Poultry without skin. Dairy Low-fat milk. Cheese. Greek yogurt. Fats and oils Extra-virgin olive oil. Avocado oil. Grapeseed oil. Beverages Water. Red wine. Herbal tea. Sweets and desserts Greek yogurt with honey. Baked apples. Poached pears. Trail mix. Seasonings and condiments Basil. Cilantro. Coriander. Cumin. Mint. Parsley. Sage. Rosemary. Tarragon. Garlic. Oregano. Thyme. Pepper. Balsamic vinegar. Tahini. Hummus. Tomato sauce. Olives. Mushrooms. The items listed above may not be a complete list of foods and beverages you can eat. Contact a dietitian for more information. What foods should I limit? This is a list of foods that should be eaten rarely or only on special occasions. Fruits Fruit canned in syrup. Vegetables Deep-fried potatoes (french fries). Grains Prepackaged pasta or rice dishes. Prepackaged cereal with added sugar. Prepackaged snacks with added sugar. Meats and other proteins Beef. Pork. Lamb. Poultry with skin. Hot dogs. Berniece Salines. Dairy Ice cream. Sour cream. Whole milk. Fats and  oils Butter. Canola oil. Vegetable oil. Beef fat (tallow). Lard. Beverages Juice. Sugar-sweetened soft drinks. Beer. Liquor and spirits. Sweets and desserts Cookies. Cakes. Pies. Candy. Seasonings and condiments Mayonnaise. Pre-made sauces and marinades. The items listed above may not be a complete list of foods and beverages you should limit. Contact a dietitian for more information. Summary The Mediterranean diet includes both food and lifestyle  choices. Eat a variety of fresh fruits and vegetables, beans, nuts, seeds, and whole grains. Limit the amount of red meat and sweets that you eat. If recommended by your health care provider, drink red wine in moderation. This means 1 glass a day for nonpregnant women and 2 glasses a day for men. A glass of wine equals 5 oz (150 mL). This information is not intended to replace advice given to you by your health care provider. Make sure you discuss any questions you have with your health care provider. Document Revised: 08/08/2019 Document Reviewed: 06/05/2019 Elsevier Patient Education  [place] [employer]         Signed, Emmaline Life, NP  01/27/2022 10:21 AM    [place]

## 2022-01-25 NOTE — Progress Notes (Signed)
Cardiology Office Note:    Date:  01/27/2022   ID:  Calvin [name], DOB October 15, 1955, MRN [id number]  PCP:  Waldemar Dickens, MD   [facility] Providers Cardiologist:  Sanda Klein, MD     Referring MD: Waldemar Dickens, MD   Chief Complaint: hospital follow-up  History of Present Illness:    Calvin [name] is a very pleasant 66 y.o. male with a hx of hypertension, GERD, recently diagnosed atrial fibrillation.  He had normal ETT and echo in 2021 ordered by PCP for routine cardiac evaluation.  Calcium score of 0 on CT cardiac scoring 09/2021.  He presented to the ED on 01/19/2022 following multiple episodes of nonbloody diarrhea, low appetite and fatigue.  Due to ongoing symptoms he was seen by PCP the day prior and diagnosed with severe dehydration and given IV fluid in clinic.  He was also started on an antibiotic. He was found to be in atrial fibrillation on exam and EKG.  He was sent home and advised to drink fluids.  He continued to drink Gatorade.  On 7/6 he went to PCP again and remained in atrial fibrillation.  He was advised to come to ER for further evaluation. Upon arrival, HR in the 120s.  He was admitted for A-fib RVR and AKI with serum creatinine of 1.67. Started on IV diltiazem. Chest and abdominal x-ray without acute findings. Renal ultrasound reassuring.  CHA2DS2-VASc score of 1 for hypertension.  Per Dr. Sallyanne Kuster, preliminary review of the echocardiogram shows normal left and right ventricular systolic function.  However both the right and left atria are mildly dilated.  This suggests some chronicity to the rhythm abnormality.  There are no serious valvular abnormalities, regional wall motion abnormalities or pericardial disease.  Compared the images side-by-side with the study from 2021 and indeed just 2 years ago left and right atrial sizes appeared smaller, in normal range which were rather surprising. Dr. Sallyanne Kuster felt possible that he has a high burden of atrial  fibrillation then we realize due to atrial myopathy.  A less likely explanation would be that he is in the early stages of a restrictive cardiomyopathy such as cardiac amyloidosis. Discussion of anticoagulation ensued as he will gain a point in 6 months when he turns 67. He was started on Eliqius 5 mg twice daily and continued on diltiazem 180 mg daily.   Today, he is alone for follow-up. Reports he finally feels like the GI issues have resolved.  Feels an occasional "tic" in the chest that is not bothersome.  Had a flushed feeling initially after starting diltiazem, was concerned that BP was elevated.  Flushing has since resolved and he feels it may have been related to transition in antihypertensives. No bleeding concerns since starting Eliquis, no missed doses.  Reports his family is very concerned about recent diagnosis.  We discussed some information to share with them to hopefully calm them. He denies chest pain, shortness of breath, lower extremity edema, fatigue, melena, hematuria, hemoptysis, diaphoresis, weakness, presyncope, syncope, orthopnea, and PND.  Past Medical History:  Diagnosis Date   Allergy    Family history of early CAD    GERD (gastroesophageal reflux disease)    Hypertension    Nasal turbinate hypertrophy     Past Surgical History:  Procedure Laterality Date   ANKLE ARTHRODESIS  1980   bone spur-right   breast cyst removal Right    COLONOSCOPY     CYST EXCISION  1979   right chest  NASAL SEPTOPLASTY W/ TURBINOPLASTY Bilateral 08/22/2019   Procedure: NASAL SEPTOPLASTY WITH TURBINATE REDUCTION;  Surgeon: Jodi Marble, MD;  Location: [place];  Service: ENT;  Laterality: Bilateral;   Testicular Cyst Right     Current Medications: Current Meds  Medication Sig   acetaminophen (TYLENOL) 500 MG tablet Take 1,000-1,500 mg by mouth 2 (two) times daily as needed for fever.   cholecalciferol (VITAMIN D3) 25 MCG (1000 UNIT) tablet Take 1,000 Units by mouth  every morning.   diclofenac Sodium (VOLTAREN) 1 % GEL Apply 1 Application topically daily as needed (joint pain).   hydrochlorothiazide (HYDRODIURIL) 25 MG tablet Take 25 mg by mouth every morning.   montelukast (SINGULAIR) 10 MG tablet Take 10 mg by mouth daily as needed (seasonal allergies).   Multiple Vitamins-Minerals (MULTIVITAMIN WITH MINERALS) tablet Take 1 tablet by mouth every morning.   omeprazole (PRILOSEC) 20 MG capsule Take 20 mg by mouth every morning.   [DISCONTINUED] apixaban (ELIQUIS) 5 MG TABS tablet Take 1 tablet (5 mg total) by mouth 2 (two) times daily.   [DISCONTINUED] diltiazem (CARDIZEM CD) 180 MG 24 hr capsule Take 1 capsule (180 mg total) by mouth daily.     Allergies:   Codeine   Social History   Socioeconomic History   Marital status: Married    Spouse name: Not on file   Number of children: Not on file   Years of education: Not on file   Highest education level: Not on file  Occupational History   Not on file  Tobacco Use   Smoking status: Never   Smokeless tobacco: Never  Vaping Use   Vaping Use: Never used  Substance and Sexual Activity   Alcohol use: Yes    Comment: occ   Drug use: No   Sexual activity: Not on file  Other Topics Concern   Not on file  Social History Narrative   Not on file   Social Determinants of Health   Financial Resource Strain: Not on file  Food Insecurity: Not on file  Transportation Needs: Not on file  Physical Activity: Not on file  Stress: Not on file  Social Connections: Not on file     Family History: The patient's family history includes Esophageal cancer in his father. There is no history of Colon cancer, Rectal cancer, Stomach cancer, or Pancreatic cancer.  ROS:   Please see the history of present illness.  All other systems reviewed and are negative.  Labs/Other Studies Reviewed:    The following studies were reviewed today:  Echo 01/20/22  1. Left ventricular ejection fraction, by estimation, is  55 to 60%. The  left ventricle has normal function. The left ventricle has no regional  wall motion abnormalities. Left ventricular diastolic parameters are  indeterminate.   2. Right ventricular systolic function is normal. The right ventricular  size is normal. Tricuspid regurgitation signal is inadequate for assessing  PA pressure.   3. Left atrial size was mildly dilated.   4. Right atrial size was mildly dilated.   5. The mitral valve is normal in structure. No evidence of mitral valve  regurgitation. No evidence of mitral stenosis.   6. The aortic valve is tricuspid. Aortic valve regurgitation is not  visualized. Aortic valve sclerosis is present, with no evidence of aortic  valve stenosis.   7. The inferior vena cava is normal in size with greater than 50%  respiratory variability, suggesting right atrial pressure of 3 mmHg.   ETT 08/20/19  Excellent exercise capacity (13.2 METS) Upsloping ST segment depression during stress that likely represents normal J point depression with exercise. No evidence of ischemia  CT Cardiac Scoring 09/27/21  IMPRESSION: 1. Coronary calcium score is 0. 2. Hepatic cysts. 3. 2 mm right lung nodule. No follow-up needed if patient is low-risk. Non-contrast chest CT can be considered in 12 months if patient is high-risk. This recommendation follows the consensus statement: Guidelines for Management of Incidental Pulmonary Nodules Detected on CT Images: From the Fleischner Society 2017; Radiology 2017; 284:228-243.  Echo 08/12/19  1. Left ventricular ejection fraction, by visual estimation, is 60 to  65%. The left ventricle has normal function. There is no left ventricular  hypertrophy.   2. The left ventricle has no regional wall motion abnormalities.   3. Global right ventricle has normal systolic function.The right  ventricular size is normal. No increase in right ventricular wall  thickness.   4. Left atrial size was normal.   5. Right  atrial size was normal.   6. The mitral valve is normal in structure. Trivial mitral valve  regurgitation. No evidence of mitral stenosis.   7. The tricuspid valve is grossly normal.   8. The tricuspid valve is grossly normal. Tricuspid valve regurgitation  is not demonstrated.   9. The aortic valve is normal in structure. Aortic valve regurgitation is  not visualized. No evidence of aortic valve sclerosis or stenosis.  10. The pulmonic valve was grossly normal. Pulmonic valve regurgitation is  not visualized.  11. The atrial septum is grossly normal.    Recent Labs: 01/19/2022: B Natriuretic Peptide 62.5 01/20/2022: ALT 71; BUN 12; Creatinine, Ser 1.14; Potassium 3.0; Sodium 135; TSH 1.615 01/21/2022: Hemoglobin 15.5; Platelets PLATELET CLUMPS NOTED ON SMEAR, UNABLE TO ESTIMATE  Recent Lipid Panel No results found for: "CHOL", "TRIG", "HDL", "CHOLHDL", "VLDL", "LDLCALC", "LDLDIRECT"   Risk Assessment/Calculations:    CHA2DS2-VASc Score = 1  This indicates a 0.6% annual risk of stroke. The patient's score is based upon: CHF History: 0 HTN History: 1 Diabetes History: 0 Stroke History: 0 Vascular Disease History: 0 Age Score: 0 Gender Score: 0       Physical Exam:    VS:  BP 122/70   Pulse 87   Ht '6\' 1"'$  (1.854 m)   Wt 246 lb 12.8 oz (111.9 kg)   SpO2 96%   BMI 32.56 kg/m     Wt Readings from Last 3 Encounters:  01/27/22 246 lb 12.8 oz (111.9 kg)  01/19/22 250 lb (113.4 kg)  08/22/19 253 lb 1.4 oz (114.8 kg)     GEN:  Well nourished, well developed in no acute distress HEENT: Normal NECK: No JVD; No carotid bruits CARDIAC: Irregular RR, no murmurs, rubs, gallops RESPIRATORY:  Clear to auscultation without rales, wheezing or rhonchi  ABDOMEN: Soft, non-tender, non-distended MUSCULOSKELETAL:  Mild bilateral LE edema; No deformity. 2+ pedal pulses, equal bilaterally SKIN: Warm and dry NEUROLOGIC:  Alert and oriented x 3 PSYCHIATRIC:  Normal affect   EKG:  EKG is  ordered today.  The ekg ordered today demonstrates atrial fibrillation with ventricular rate 87 bpm, no acute change from previous tracing  Diagnoses:    1. Persistent atrial fibrillation ([place])   2. Chronic anticoagulation   3. Essential hypertension   4. Family history of early CAD    Assessment and Plan:     Persistent atrial fibrillation on chronic anticoagulation: He remains in atrial fibrillation today. HR well controlled at 87 bpm. He is  feeling well on diltiazem.  No bleeding problems on Eliquis, no missed doses. We discussed various treatment options for atrial fibrillation. Mild biatrial enlargement on echo 7/7 in comparison to echo from 2021. DCCV recommended by Dr. Sallyanne Kuster at time of hospitalization. Risks of cardioversion discussed and he would like to proceed. He will need to continue anticoagulation for 2 more weeks. He is aware he will have to continue anticoagulation for at least 4 weeks following cardioversion. Will plan for EKG 1-2 days prior to DCCV to ensure that he remains in a fib. Will get pre-procedure lab work at that time.   Hypertension: BP is well-controlled. He is feeling well since switching to diltiazem. No changes today.   Family history of early CAD: Calcium score of zero on 09/27/21. Normal ETT and echo in 2021.  We do not have cholesterol screening to review. We will check fasting lipids at upcoming lab appointment for preprocedure labs.  Encouraged regular exercise and heart healthy, mostly plant-based diet   Shared Decision Making/Informed Consent The risks (stroke, cardiac arrhythmias rarely resulting in the need for a temporary or permanent pacemaker, skin irritation or burns and complications associated with conscious sedation including aspiration, arrhythmia, respiratory failure and death), benefits (restoration of normal sinus rhythm) and alternatives of a direct current cardioversion were explained in detail to Calvin [name] and he agrees to proceed.       Medication Adjustments/Labs and Tests Ordered: Current medicines are reviewed at length with the patient today.  Concerns regarding medicines are outlined above.  Orders Placed This Encounter  Procedures   CBC   Basic Metabolic Panel (BMET)   Lipid Profile   EKG 12-Lead   Meds ordered this encounter  Medications   apixaban (ELIQUIS) 5 MG TABS tablet    Sig: Take 1 tablet (5 mg total) by mouth 2 (two) times daily.    Dispense:  180 tablet    Refill:  3   diltiazem (CARDIZEM CD) 180 MG 24 hr capsule    Sig: Take 1 capsule (180 mg total) by mouth daily.    Dispense:  90 capsule    Refill:  3    Patient Instructions  Medication Instructions:   Your physician recommends that you continue on your current medications as directed. Please refer to the Current Medication list given to you today.   *If you need a refill on your cardiac medications before your next appointment, please call your pharmacy*   Lab Work:  Your physician recommends that you return for lab work on Monday, July 31 between 7:30-4:30.    If you have labs (blood work) drawn today and your tests are completely normal, you will receive your results only by: [place] (if you have MyChart) OR A paper copy in the mail If you have any lab test that is abnormal or we need to change your treatment, we will call you to review the results.   Testing/Procedures:  Dear Calvin [name], You are scheduled for a TEE/Cardioversion/TEE Cardioversion on Wednesday, August 2 with Dr. Gardiner Rhyme.  Please arrive at the [facility] (Main Entrance A) at [facility]: [address] [place], [place] [address] at 11:30 am. (1 hour prior to procedure unless lab work is needed; if lab work is needed arrive 1.5 hours ahead)  DIET: Nothing to eat or drink after midnight except a sip of water with medications (see medication instructions below)  FYI: For your safety, and to allow [place] to monitor your vital signs accurately  during the surgery/procedure we request that   if you have artificial nails, gel coating, SNS etc. Please have those removed prior to your surgery/procedure. Not having the nail coverings /polish removed may result in cancellation or delay of your surgery/procedure.   Medication Instructions: Hold HCTZ the am of cardioversion  Continue your anticoagulant: Eliquis You will need to continue your anticoagulant after your procedure until you  are told by your  Provider that it is safe to stop   Come to: Monday, July 31 (Lab option #1) Come to the lab at [facility] between the hours of 8:00 am and 4:30 pm. You do have to be fasting.  You must have a responsible person to drive you home and stay in the waiting area during your procedure. Failure to do so could result in cancellation.  Bring your insurance cards.  *Special Note: Every effort is made to have your procedure done on time. Occasionally there are emergencies that occur at the hospital that may cause delays. Please be patient if a delay does occur.     Follow-Up: At [facility], you and your health needs are our priority.  As part of our continuing mission to provide you with exceptional heart care, we have created designated Provider Care Teams.  These Care Teams include your primary Cardiologist (physician) and Advanced Practice Providers (APPs -  Physician Assistants and Nurse Practitioners) who all work together to provide you with the care you need, when you need it.  We recommend signing up for the patient portal called "MyChart".  Sign up information is provided on this After Visit Summary.  MyChart is used to connect with patients for Virtual Visits (Telemedicine).  Patients are able to view lab/test results, encounter notes, upcoming appointments, etc.  Non-urgent messages can be sent to your provider as well.   To learn more about what you can do with MyChart, go to [email or web address].    Your next  appointment:   4 week(s)  The format for your next appointment:   In Person  Provider:   Sanda Klein, MD     Other Instructions   You will come in on Monday, July 31 at 11:00 am for nurse visit to get EKG.  Mediterranean Diet A Mediterranean diet refers to food and lifestyle choices that are based on the traditions of countries located on the [employer]. It focuses on eating more fruits, vegetables, whole grains, beans, nuts, seeds, and heart-healthy fats, and eating less dairy, meat, eggs, and processed foods with added sugar, salt, and fat. This way of eating has been shown to help prevent certain conditions and improve outcomes for people who have chronic diseases, like kidney disease and heart disease. What are tips for following this plan? Reading food labels Check the serving size of packaged foods. For foods such as rice and pasta, the serving size refers to the amount of cooked product, not dry. Check the total fat in packaged foods. Avoid foods that have saturated fat or trans fats. Check the ingredient list for added sugars, such as corn syrup. Shopping  Buy a variety of foods that offer a balanced diet, including: Fresh fruits and vegetables (produce). Grains, beans, nuts, and seeds. Some of these may be available in unpackaged forms or large amounts (in bulk). Fresh seafood. Poultry and eggs. Low-fat dairy products. Buy whole ingredients instead of prepackaged foods. Buy fresh fruits and vegetables in-season from local farmers markets. Buy plain frozen fruits and vegetables. If you do  not have access to quality fresh seafood, buy precooked frozen shrimp or canned fish, such as tuna, salmon, or sardines. Stock your pantry so you always have certain foods on hand, such as olive oil, canned tuna, canned tomatoes, rice, pasta, and beans. Cooking Cook foods with extra-virgin olive oil instead of using butter or other vegetable oils. Have meat as a side dish, and  have vegetables or grains as your main dish. This means having meat in small portions or adding small amounts of meat to foods like pasta or stew. Use beans or vegetables instead of meat in common dishes like chili or lasagna. Experiment with different cooking methods. Try roasting, broiling, steaming, and sauting vegetables. Add frozen vegetables to soups, stews, pasta, or rice. Add nuts or seeds for added healthy fats and plant protein at each meal. You can add these to yogurt, salads, or vegetable dishes. Marinate fish or vegetables using olive oil, lemon juice, garlic, and fresh herbs. Meal planning Plan to eat one vegetarian meal one day each week. Try to work up to two vegetarian meals, if possible. Eat seafood two or more times a week. Have healthy snacks readily available, such as: Vegetable sticks with hummus. Greek yogurt. Fruit and nut trail mix. Eat balanced meals throughout the week. This includes: Fruit: 2-3 servings a day. Vegetables: 4-5 servings a day. Low-fat dairy: 2 servings a day. Fish, poultry, or lean meat: 1 serving a day. Beans and legumes: 2 or more servings a week. Nuts and seeds: 1-2 servings a day. Whole grains: 6-8 servings a day. Extra-virgin olive oil: 3-4 servings a day. Limit red meat and sweets to only a few servings a month. Lifestyle  Cook and eat meals together with your family, when possible. Drink enough fluid to keep your urine pale yellow. Be physically active every day. This includes: Aerobic exercise like running or swimming. Leisure activities like gardening, walking, or housework. Get 7-8 hours of sleep each night. If recommended by your health care provider, drink red wine in moderation. This means 1 glass a day for nonpregnant women and 2 glasses a day for men. A glass of wine equals 5 oz (150 mL). What foods should I eat? Fruits Apples. Apricots. Avocado. Berries. Bananas. Cherries. Dates. Figs. Grapes. Lemons. Melon. Oranges.  Peaches. Plums. Pomegranate. Vegetables Artichokes. Beets. Broccoli. Cabbage. Carrots. Eggplant. Green beans. Chard. Kale. Spinach. Onions. Leeks. Peas. Squash. Tomatoes. Peppers. Radishes. Grains Whole-grain pasta. Brown rice. Bulgur wheat. Polenta. Couscous. Whole-wheat bread. [place]. Meats and other proteins Beans. Almonds. Sunflower seeds. Pine nuts. Peanuts. [place]. Salmon. Scallops. Shrimp. [place]. Tilapia. Clams. Oysters. Eggs. Poultry without skin. Dairy Low-fat milk. Cheese. Greek yogurt. Fats and oils Extra-virgin olive oil. Avocado oil. Grapeseed oil. Beverages Water. Red wine. Herbal tea. Sweets and desserts Greek yogurt with honey. Baked apples. Poached pears. Trail mix. Seasonings and condiments Basil. Cilantro. Coriander. Cumin. Mint. Parsley. Sage. Rosemary. Tarragon. Garlic. Oregano. Thyme. Pepper. Balsamic vinegar. Tahini. Hummus. Tomato sauce. Olives. Mushrooms. The items listed above may not be a complete list of foods and beverages you can eat. Contact a dietitian for more information. What foods should I limit? This is a list of foods that should be eaten rarely or only on special occasions. Fruits Fruit canned in syrup. Vegetables Deep-fried potatoes (french fries). Grains Prepackaged pasta or rice dishes. Prepackaged cereal with added sugar. Prepackaged snacks with added sugar. Meats and other proteins Beef. Pork. Lamb. Poultry with skin. Hot dogs. Berniece Salines. Dairy Ice cream. Sour cream. Whole milk. Fats and  oils Butter. Canola oil. Vegetable oil. Beef fat (tallow). Lard. Beverages Juice. Sugar-sweetened soft drinks. Beer. Liquor and spirits. Sweets and desserts Cookies. Cakes. Pies. Candy. Seasonings and condiments Mayonnaise. Pre-made sauces and marinades. The items listed above may not be a complete list of foods and beverages you should limit. Contact a dietitian for more information. Summary The Mediterranean diet includes both food and lifestyle  choices. Eat a variety of fresh fruits and vegetables, beans, nuts, seeds, and whole grains. Limit the amount of red meat and sweets that you eat. If recommended by your health care provider, drink red wine in moderation. This means 1 glass a day for nonpregnant women and 2 glasses a day for men. A glass of wine equals 5 oz (150 mL). This information is not intended to replace advice given to you by your health care provider. Make sure you discuss any questions you have with your health care provider. Document Revised: 08/08/2019 Document Reviewed: 06/05/2019 Elsevier Patient Education  [place]         Signed, Emmaline Life, NP  01/27/2022 10:21 AM    [place]

## 2022-01-27 ENCOUNTER — Encounter: Payer: Self-pay | Admitting: Nurse Practitioner

## 2022-01-27 ENCOUNTER — Ambulatory Visit: Payer: No Typology Code available for payment source | Admitting: Nurse Practitioner

## 2022-01-27 ENCOUNTER — Ambulatory Visit: Payer: 59 | Admitting: Nurse Practitioner

## 2022-01-27 VITALS — BP 122/70 | HR 87 | Ht 73.0 in | Wt 246.8 lb

## 2022-01-27 DIAGNOSIS — Z7901 Long term (current) use of anticoagulants: Secondary | ICD-10-CM | POA: Diagnosis not present

## 2022-01-27 DIAGNOSIS — Z8249 Family history of ischemic heart disease and other diseases of the circulatory system: Secondary | ICD-10-CM | POA: Diagnosis not present

## 2022-01-27 DIAGNOSIS — I10 Essential (primary) hypertension: Secondary | ICD-10-CM | POA: Diagnosis not present

## 2022-01-27 DIAGNOSIS — I4819 Other persistent atrial fibrillation: Secondary | ICD-10-CM

## 2022-01-27 MED ORDER — APIXABAN 5 MG PO TABS: 5.0000 mg | ORAL_TABLET | Freq: Two times a day (BID) | ORAL | 3 refills | Status: DC

## 2022-01-27 MED ORDER — DILTIAZEM HCL ER COATED BEADS 180 MG PO CP24: 180.0000 mg | ORAL_CAPSULE | Freq: Every day | ORAL | 3 refills | Status: DC

## 2022-01-27 NOTE — Patient Instructions (Signed)
Medication Instructions:   Your physician recommends that you continue on your current medications as directed. Please refer to the Current Medication list given to you today.   *If you need a refill on your cardiac medications before your next appointment, please call your pharmacy*   Lab Work:  Your physician recommends that you return for lab work on Monday, July 31 between 7:30-4:30.    If you have labs (blood work) drawn today and your tests are completely normal, you will receive your results only by: [place] (if you have MyChart) OR A paper copy in the mail If you have any lab test that is abnormal or we need to change your treatment, we will call you to review the results.   Testing/Procedures:  Dear Mr. Callanan, You are scheduled for a TEE/Cardioversion/TEE Cardioversion on Wednesday, August 2 with Dr. Gardiner Rhyme.  Please arrive at the [facility] (Main Entrance A) at [facility]: [address] [place], [place] [address] at 11:30 am. (1 hour prior to procedure unless lab work is needed; if lab work is needed arrive 1.5 hours ahead)  DIET: Nothing to eat or drink after midnight except a sip of water with medications (see medication instructions below)  FYI: For your safety, and to allow [place] to monitor your vital signs accurately during the surgery/procedure we request that   if you have artificial nails, gel coating, SNS etc. Please have those removed prior to your surgery/procedure. Not having the nail coverings /polish removed may result in cancellation or delay of your surgery/procedure.   Medication Instructions: Hold HCTZ the am of cardioversion  Continue your anticoagulant: Eliquis You will need to continue your anticoagulant after your procedure until you  are told by your  Provider that it is safe to stop   Come to: Monday, July 31 (Lab option #1) Come to the lab at [facility] between the hours of 8:00 am and 4:30 pm. You do have to be  fasting.  You must have a responsible person to drive you home and stay in the waiting area during your procedure. Failure to do so could result in cancellation.  Bring your insurance cards.  *Special Note: Every effort is made to have your procedure done on time. Occasionally there are emergencies that occur at the hospital that may cause delays. Please be patient if a delay does occur.     Follow-Up: At [facility], you and your health needs are our priority.  As part of our continuing mission to provide you with exceptional heart care, we have created designated Provider Care Teams.  These Care Teams include your primary Cardiologist (physician) and Advanced Practice Providers (APPs -  Physician Assistants and Nurse Practitioners) who all work together to provide you with the care you need, when you need it.  We recommend signing up for the patient portal called "MyChart".  Sign up information is provided on this After Visit Summary.  MyChart is used to connect with patients for Virtual Visits (Telemedicine).  Patients are able to view lab/test results, encounter notes, upcoming appointments, etc.  Non-urgent messages can be sent to your provider as well.   To learn more about what you can do with MyChart, go to [email or web address].    Your next appointment:   4 week(s)  The format for your next appointment:   In Person  Provider:   Sanda Klein, MD     Other Instructions   You will come in on Monday, July  31 at 11:00 am for nurse visit to get EKG.  Mediterranean Diet A Mediterranean diet refers to food and lifestyle choices that are based on the traditions of countries located on the [employer]. It focuses on eating more fruits, vegetables, whole grains, beans, nuts, seeds, and heart-healthy fats, and eating less dairy, meat, eggs, and processed foods with added sugar, salt, and fat. This way of eating has been shown to help prevent certain conditions and improve  outcomes for people who have chronic diseases, like kidney disease and heart disease. What are tips for following this plan? Reading food labels Check the serving size of packaged foods. For foods such as rice and pasta, the serving size refers to the amount of cooked product, not dry. Check the total fat in packaged foods. Avoid foods that have saturated fat or trans fats. Check the ingredient list for added sugars, such as corn syrup. Shopping  Buy a variety of foods that offer a balanced diet, including: Fresh fruits and vegetables (produce). Grains, beans, nuts, and seeds. Some of these may be available in unpackaged forms or large amounts (in bulk). Fresh seafood. Poultry and eggs. Low-fat dairy products. Buy whole ingredients instead of prepackaged foods. Buy fresh fruits and vegetables in-season from local farmers markets. Buy plain frozen fruits and vegetables. If you do not have access to quality fresh seafood, buy precooked frozen shrimp or canned fish, such as tuna, salmon, or sardines. Stock your pantry so you always have certain foods on hand, such as olive oil, canned tuna, canned tomatoes, rice, pasta, and beans. Cooking Cook foods with extra-virgin olive oil instead of using butter or other vegetable oils. Have meat as a side dish, and have vegetables or grains as your main dish. This means having meat in small portions or adding small amounts of meat to foods like pasta or stew. Use beans or vegetables instead of meat in common dishes like chili or lasagna. Experiment with different cooking methods. Try roasting, broiling, steaming, and sauting vegetables. Add frozen vegetables to soups, stews, pasta, or rice. Add nuts or seeds for added healthy fats and plant protein at each meal. You can add these to yogurt, salads, or vegetable dishes. Marinate fish or vegetables using olive oil, lemon juice, garlic, and fresh herbs. Meal planning Plan to eat one vegetarian meal one  day each week. Try to work up to two vegetarian meals, if possible. Eat seafood two or more times a week. Have healthy snacks readily available, such as: Vegetable sticks with hummus. Greek yogurt. Fruit and nut trail mix. Eat balanced meals throughout the week. This includes: Fruit: 2-3 servings a day. Vegetables: 4-5 servings a day. Low-fat dairy: 2 servings a day. Fish, poultry, or lean meat: 1 serving a day. Beans and legumes: 2 or more servings a week. Nuts and seeds: 1-2 servings a day. Whole grains: 6-8 servings a day. Extra-virgin olive oil: 3-4 servings a day. Limit red meat and sweets to only a few servings a month. Lifestyle  Cook and eat meals together with your family, when possible. Drink enough fluid to keep your urine pale yellow. Be physically active every day. This includes: Aerobic exercise like running or swimming. Leisure activities like gardening, walking, or housework. Get 7-8 hours of sleep each night. If recommended by your health care provider, drink red wine in moderation. This means 1 glass a day for nonpregnant women and 2 glasses a day for men. A glass of wine equals 5 oz (150 mL). What  foods should I eat? Fruits Apples. Apricots. Avocado. Berries. Bananas. Cherries. Dates. Figs. Grapes. Lemons. Melon. Oranges. Peaches. Plums. Pomegranate. Vegetables Artichokes. Beets. Broccoli. Cabbage. Carrots. Eggplant. Green beans. Chard. Kale. Spinach. Onions. Leeks. Peas. Squash. Tomatoes. Peppers. Radishes. Grains Whole-grain pasta. Brown rice. Bulgur wheat. Polenta. Couscous. Whole-wheat bread. [place]. Meats and other proteins Beans. Almonds. Sunflower seeds. Pine nuts. Peanuts. [place]. Salmon. Scallops. Shrimp. [place]. Tilapia. Clams. Oysters. Eggs. Poultry without skin. Dairy Low-fat milk. Cheese. Greek yogurt. Fats and oils Extra-virgin olive oil. Avocado oil. Grapeseed oil. Beverages Water. Red wine. Herbal tea. Sweets and desserts Greek yogurt  with honey. Baked apples. Poached pears. Trail mix. Seasonings and condiments Basil. Cilantro. Coriander. Cumin. Mint. Parsley. Sage. Rosemary. Tarragon. Garlic. Oregano. Thyme. Pepper. Balsamic vinegar. Tahini. Hummus. Tomato sauce. Olives. Mushrooms. The items listed above may not be a complete list of foods and beverages you can eat. Contact a dietitian for more information. What foods should I limit? This is a list of foods that should be eaten rarely or only on special occasions. Fruits Fruit canned in syrup. Vegetables Deep-fried potatoes (french fries). Grains Prepackaged pasta or rice dishes. Prepackaged cereal with added sugar. Prepackaged snacks with added sugar. Meats and other proteins Beef. Pork. Lamb. Poultry with skin. Hot dogs. Berniece Salines. Dairy Ice cream. Sour cream. Whole milk. Fats and oils Butter. Canola oil. Vegetable oil. Beef fat (tallow). Lard. Beverages Juice. Sugar-sweetened soft drinks. Beer. Liquor and spirits. Sweets and desserts Cookies. Cakes. Pies. Candy. Seasonings and condiments Mayonnaise. Pre-made sauces and marinades. The items listed above may not be a complete list of foods and beverages you should limit. Contact a dietitian for more information. Summary The Mediterranean diet includes both food and lifestyle choices. Eat a variety of fresh fruits and vegetables, beans, nuts, seeds, and whole grains. Limit the amount of red meat and sweets that you eat. If recommended by your health care provider, drink red wine in moderation. This means 1 glass a day for nonpregnant women and 2 glasses a day for men. A glass of wine equals 5 oz (150 mL). This information is not intended to replace advice given to you by your health care provider. Make sure you discuss any questions you have with your health care provider. Document Revised: 08/08/2019 Document Reviewed: 06/05/2019 Elsevier Patient Education  [place]

## 2022-02-08 ENCOUNTER — Encounter (HOSPITAL_COMMUNITY): Payer: Self-pay | Admitting: Cardiology

## 2022-02-08 NOTE — Progress Notes (Signed)
Attempted to obtain medical history via telephone, unable to reach at this time. HIPAA compliant voicemail message left requesting return call to pre surgical testing department. 

## 2022-02-13 ENCOUNTER — Ambulatory Visit (INDEPENDENT_AMBULATORY_CARE_PROVIDER_SITE_OTHER): Payer: 59

## 2022-02-13 ENCOUNTER — Other Ambulatory Visit: Payer: 59

## 2022-02-13 VITALS — BP 126/84 | HR 88 | Ht 73.0 in | Wt 244.2 lb

## 2022-02-13 DIAGNOSIS — Z01812 Encounter for preprocedural laboratory examination: Secondary | ICD-10-CM | POA: Diagnosis not present

## 2022-02-13 DIAGNOSIS — I4819 Other persistent atrial fibrillation: Secondary | ICD-10-CM

## 2022-02-13 LAB — LIPID PANEL
Chol/HDL Ratio: 4 ratio (ref 0.0–5.0)
Cholesterol, Total: 163 mg/dL (ref 100–199)
HDL: 41 mg/dL (ref >39)
LDL Chol Calc (NIH): 104 mg/dL — ABNORMAL HIGH (ref 0–99)
Triglycerides: 98 mg/dL (ref 0–149)
VLDL Cholesterol Cal: 18 mg/dL (ref 5–40)

## 2022-02-13 LAB — BASIC METABOLIC PANEL
BUN/Creatinine Ratio: 12 (ref 10–24)
BUN: 17 mg/dL (ref 8–27)
CO2: 25 mmol/L (ref 20–29)
Calcium: 9.6 mg/dL (ref 8.6–10.2)
Chloride: 99 mmol/L (ref 96–106)
Creatinine, Ser: 1.37 mg/dL — ABNORMAL HIGH (ref 0.76–1.27)
Glucose: 86 mg/dL (ref 70–99)
Potassium: 4.4 mmol/L (ref 3.5–5.2)
Sodium: 137 mmol/L (ref 134–144)
eGFR: 58 mL/min/{1.73_m2} — ABNORMAL LOW (ref >59)

## 2022-02-13 LAB — CBC
Hematocrit: 49.9 % (ref 37.5–51.0)
Hemoglobin: 17.4 g/dL (ref 13.0–17.7)
MCH: 28.8 pg (ref 26.6–33.0)
MCHC: 34.9 g/dL (ref 31.5–35.7)
MCV: 83 fL (ref 79–97)
Platelets: 238 10*3/uL (ref 150–450)
RBC: 6.05 x10E6/uL — ABNORMAL HIGH (ref 4.14–5.80)
RDW: 13.4 % (ref 11.6–15.4)
WBC: 5.7 10*3/uL (ref 3.4–10.8)

## 2022-02-13 NOTE — Patient Instructions (Signed)
Medication Instructions:  Your physician recommends that you continue on your current medications as directed. Please refer to the Current Medication list given to you today.  *If you need a refill on your cardiac medications before your next appointment, please call your pharmacy*   Lab Work: None ordered.  If you have labs (blood work) drawn today and your tests are completely normal, you will receive your results only by: [place] (if you have MyChart) OR A paper copy in the mail If you have any lab test that is abnormal or we need to change your treatment, we will call you to review the results.   Testing/Procedures: None ordered.    Follow-Up: At [facility], you and your health needs are our priority.  As part of our continuing mission to provide you with exceptional heart care, we have created designated Provider Care Teams.  These Care Teams include your primary Cardiologist (physician) and Advanced Practice Providers (APPs -  Physician Assistants and Nurse Practitioners) who all work together to provide you with the care you need, when you need it.  We recommend signing up for the patient portal called "MyChart".  Sign up information is provided on this After Visit Summary.  MyChart is used to connect with patients for Virtual Visits (Telemedicine).  Patients are able to view lab/test results, encounter notes, upcoming appointments, etc.  Non-urgent messages can be sent to your provider as well.   To learn more about what you can do with MyChart, go to [email or web address].    Your next appointment:   Keep appointments as scheduled    Important Information About Sugar

## 2022-02-13 NOTE — Progress Notes (Unsigned)
   Nurse Visit   Date of Encounter: 02/13/2022 ID: Calvin [name], DOB 07-Nov-1955, MRN [id number]  PCP:  Waldemar Dickens, MD   [facility] Providers Cardiologist:  Sanda Klein, MD { Click to update primary MD,subspecialty MD or APP then REFRESH:1}     Visit Details   VS:  BP 126/84   Pulse 88   Ht '6\' 1"'$  (1.854 m)   Wt 244 lb 3.2 oz (110.8 kg)   BMI 32.22 kg/m  , BMI Body mass index is 32.22 kg/m.  Wt Readings from Last 3 Encounters:  02/13/22 244 lb 3.2 oz (110.8 kg)  01/27/22 246 lb 12.8 oz (111.9 kg)  01/19/22 250 lb (113.4 kg)     Reason for visit: to confirm pt remains in Afib for DCCV scheduled for 02/15/2022 with Dr Gardiner Rhyme Performed today: EKG, Vitals, consulted with Dr Tamala Julian, DOD and education. Changes (medications, testing, etc.) : No changes Length of Visit: 20 minutes    Medications Adjustments/Labs and Tests Ordered: Pt's EKG reviewed by Dr Tamala Julian.  Pt remains in Atrial Fib with controlled rate.  Pt advised to continue current medications.  He confirms as of today he has missed no doses of his Eliquis.  Reviewed pt's DCCV instructions.  Pt verbalizes understanding and agrees with current plan and follow up.    Signed, Thora Lance, RN  02/13/2022 11:37 AM

## 2022-02-15 ENCOUNTER — Other Ambulatory Visit: Payer: Self-pay

## 2022-02-15 ENCOUNTER — Ambulatory Visit (HOSPITAL_BASED_OUTPATIENT_CLINIC_OR_DEPARTMENT_OTHER): Payer: 59 | Admitting: General Practice

## 2022-02-15 ENCOUNTER — Encounter (HOSPITAL_COMMUNITY): Payer: Self-pay | Admitting: Cardiology

## 2022-02-15 ENCOUNTER — Encounter (HOSPITAL_COMMUNITY)
Admission: RE | Disposition: A | Discharge status: Home / Self Care | Payer: Self-pay | Source: Home / Self Care | Attending: Cardiology

## 2022-02-15 ENCOUNTER — Ambulatory Visit (HOSPITAL_COMMUNITY): Payer: 59 | Admitting: General Practice

## 2022-02-15 ENCOUNTER — Ambulatory Visit (HOSPITAL_COMMUNITY)
Admission: RE | Admit: 2022-02-15 | Discharge: 2022-02-15 | Disposition: A | Discharge status: Home / Self Care | Payer: 59 | Attending: Cardiology | Admitting: Cardiology

## 2022-02-15 DIAGNOSIS — Z8249 Family history of ischemic heart disease and other diseases of the circulatory system: Secondary | ICD-10-CM | POA: Insufficient documentation

## 2022-02-15 DIAGNOSIS — I4819 Other persistent atrial fibrillation: Secondary | ICD-10-CM | POA: Diagnosis not present

## 2022-02-15 DIAGNOSIS — K219 Gastro-esophageal reflux disease without esophagitis: Secondary | ICD-10-CM | POA: Insufficient documentation

## 2022-02-15 DIAGNOSIS — I10 Essential (primary) hypertension: Secondary | ICD-10-CM

## 2022-02-15 DIAGNOSIS — I4891 Unspecified atrial fibrillation: Secondary | ICD-10-CM | POA: Diagnosis not present

## 2022-02-15 DIAGNOSIS — Z79899 Other long term (current) drug therapy: Secondary | ICD-10-CM | POA: Insufficient documentation

## 2022-02-15 DIAGNOSIS — I2510 Atherosclerotic heart disease of native coronary artery without angina pectoris: Secondary | ICD-10-CM

## 2022-02-15 DIAGNOSIS — Z7901 Long term (current) use of anticoagulants: Secondary | ICD-10-CM | POA: Diagnosis not present

## 2022-02-15 HISTORY — PX: CARDIOVERSION: SHX1299

## 2022-02-15 SURGERY — CARDIOVERSION
Anesthesia: General

## 2022-02-15 MED ORDER — SODIUM CHLORIDE 0.9 % IV SOLN: INTRAVENOUS | Status: DC

## 2022-02-15 MED ORDER — LIDOCAINE 2% (20 MG/ML) 5 ML SYRINGE
INTRAMUSCULAR | Status: DC | PRN
  Administered 2022-02-15: 100 mg via INTRAVENOUS

## 2022-02-15 MED ORDER — PROPOFOL 10 MG/ML IV BOLUS
INTRAVENOUS | Status: DC | PRN
  Administered 2022-02-15: 80 mg via INTRAVENOUS

## 2022-02-15 NOTE — Discharge Instructions (Signed)

## 2022-02-15 NOTE — Transfer of Care (Signed)
Immediate Anesthesia Transfer of Care Note  Patient: Calvin [name]  Procedure(s) Performed: CARDIOVERSION  Patient Location: Endoscopy Unit  Anesthesia Type:General  Level of Consciousness: awake and drowsy  Airway & Oxygen Therapy: Patient Spontanous Breathing  Post-op Assessment: Report given to RN and Post -op Vital signs reviewed and stable  Post vital signs: Reviewed and stable  Last Vitals:  Vitals Value Taken Time  BP 130/95 (106)   Temp    Pulse 80   Resp 16   SpO2 93     Last Pain:  Vitals:   02/15/22 1026  TempSrc: Temporal  PainSc: 0-No pain         Complications: No notable events documented.

## 2022-02-15 NOTE — Anesthesia Preprocedure Evaluation (Addendum)
Anesthesia Evaluation  Patient identified by MRN, date of birth, ID band Patient awake    Reviewed: Allergy & Precautions, NPO status , Patient's Chart, lab work & pertinent test results  History of Anesthesia Complications Negative for: history of anesthetic complications  Airway Mallampati: II  TM Distance: >3 FB Neck ROM: Full    Dental no notable dental hx.    Pulmonary neg pulmonary ROS,    Pulmonary exam normal        Cardiovascular hypertension, Pt. on medications + CAD  Normal cardiovascular exam+ dysrhythmias Atrial Fibrillation   TTE 01/20/22: EF 55-60%, mild LAE/RAE, valves ok   Neuro/Psych negative neurological ROS  negative psych ROS   GI/Hepatic Neg liver ROS, GERD  Medicated and Controlled,  Endo/Other  negative endocrine ROS  Renal/GU Cr 1.37  negative genitourinary   Musculoskeletal negative musculoskeletal ROS (+)   Abdominal   Peds  Hematology negative hematology ROS (+)   Anesthesia Other Findings Day of surgery medications reviewed with patient.  Reproductive/Obstetrics negative OB ROS                           Anesthesia Physical Anesthesia Plan  ASA: 3  Anesthesia Plan: General   Post-op Pain Management: Minimal or no pain anticipated   Induction: Intravenous  PONV Risk Score and Plan: Treatment may vary due to age or medical condition and Propofol infusion  Airway Management Planned: Mask  Additional Equipment: None  Intra-op Plan:   Post-operative Plan:   Informed Consent: I have reviewed the patients History and Physical, chart, labs and discussed the procedure including the risks, benefits and alternatives for the proposed anesthesia with the patient or authorized representative who has indicated his/her understanding and acceptance.       Plan Discussed with: CRNA  Anesthesia Plan Comments:        Anesthesia Quick Evaluation

## 2022-02-15 NOTE — Anesthesia Procedure Notes (Signed)
Procedure Name: General with mask airway Date/Time: 02/15/2022 10:52 AM  Performed by: Dorann Lodge, CRNAPre-anesthesia Checklist: Patient identified, Emergency Drugs available, Suction available and Patient being monitored Patient Re-evaluated:Patient Re-evaluated prior to induction Oxygen Delivery Method: Ambu bag Preoxygenation: Pre-oxygenation with 100% oxygen Induction Type: IV induction Dental Injury: Teeth and Oropharynx as per pre-operative assessment

## 2022-02-15 NOTE — Interval H&P Note (Signed)
History and Physical Interval Note:  02/15/2022 10:34 AM  Calvin [name]  has presented today for surgery, with the diagnosis of atrial fibrillation.  The various methods of treatment have been discussed with the patient and family. After consideration of risks, benefits and other options for treatment, the patient has consented to  Procedure(s): CARDIOVERSION (N/A) as a surgical intervention.  The patient's history has been reviewed, patient examined, no change in status, stable for surgery.  I have reviewed the patient's chart and labs.  Questions were answered to the patient's satisfaction.     Donato Heinz

## 2022-02-15 NOTE — Anesthesia Postprocedure Evaluation (Signed)
Anesthesia Post Note  Patient: Calvin [name]  Procedure(s) Performed: CARDIOVERSION     Patient location during evaluation: PACU Anesthesia Type: General Level of consciousness: awake and alert Pain management: pain level controlled Vital Signs Assessment: post-procedure vital signs reviewed and stable Respiratory status: spontaneous breathing, nonlabored ventilation and respiratory function stable Cardiovascular status: blood pressure returned to baseline Postop Assessment: no apparent nausea or vomiting Anesthetic complications: no   No notable events documented.  Last Vitals:  Vitals:   02/15/22 1110 02/15/22 1120  BP: (!) 137/103 (!) 140/99  Pulse: 79   Resp: 16   Temp:    SpO2: 95%     Last Pain:  Vitals:   02/15/22 1120  TempSrc:   PainSc: 0-No pain                 Marthenia Rolling

## 2022-02-15 NOTE — CV Procedure (Signed)
Procedure:   DCCV  Indication:  Symptomatic atrial fibrillation  Procedure Note:  The patient signed informed consent.  They have had had therapeutic anticoagulation with Eliquis greater than 3 weeks.  Anesthesia was administered by Dr. Daiva Huge and Laban Emperor, CRNA.  Adequate airway was maintained throughout and vital followed per protocol.  They were cardioverted x 1 with 200J of biphasic synchronized energy.  They converted to NSR with rate 70s.  There were no apparent complications.  The patient had normal neuro status and respiratory status post procedure with vitals stable as recorded elsewhere.    Follow up:  They will continue on current medical therapy and follow up with cardiology as scheduled.  Oswaldo Milian, MD 02/15/2022 11:05 AM

## 2022-02-17 ENCOUNTER — Telehealth: Payer: Self-pay | Admitting: Cardiovascular Disease

## 2022-02-17 ENCOUNTER — Encounter (HOSPITAL_COMMUNITY): Payer: Self-pay | Admitting: Cardiology

## 2022-02-17 NOTE — Telephone Encounter (Signed)
Pt c/o BP issue: STAT if pt c/o blurred vision, one-sided weakness or slurred speech  1. What are your last 5 BP readings?  142/98 162/100  2. Are you having any other symptoms (ex. Dizziness, headache, blurred vision, passed out)? no  3. What is your BP issue? Jen from pt PCP office called to report that pt bp was elevated when he went to see them today. She denies the pt having any other symptoms

## 2022-02-17 NOTE — Telephone Encounter (Signed)
Spoke with Delsa Sale at Dr. Baker Janus office. She reported that patient's BP today was 162/100 and 142/98. Patient had cardioversion on 8/2 and was taken off lisinopril. Spoke with patient who got BP while on phone 142/82, P 81. Denies chest pain/discomfort, dizziness, sob, lightheadedness.

## 2022-02-18 NOTE — Telephone Encounter (Signed)
I am not sure why he was told to stop lisinopril. I think he should restart it.

## 2022-02-20 ENCOUNTER — Other Ambulatory Visit: Payer: Self-pay

## 2022-02-20 MED ORDER — LISINOPRIL 20 MG PO TABS: 20.0000 mg | ORAL_TABLET | Freq: Every day | ORAL | 3 refills | Status: AC

## 2022-02-20 NOTE — Telephone Encounter (Signed)
Informed patient to restart lisinopril '20mg'$  daily and to keep diary of blood pressure. He verbalized understanding. New order placed for lisinopril '20mg'$  daily.

## 2022-02-20 NOTE — Telephone Encounter (Signed)
I looked - kidney function not much different from baseline. Restart lisinopril please

## 2022-03-14 ENCOUNTER — Ambulatory Visit: Payer: 59 | Attending: Cardiovascular Disease | Admitting: Cardiovascular Disease

## 2022-03-14 ENCOUNTER — Encounter: Payer: Self-pay | Admitting: Cardiovascular Disease

## 2022-03-14 VITALS — BP 102/72 | HR 70 | Ht 73.0 in | Wt 246.4 lb

## 2022-03-14 DIAGNOSIS — G4710 Hypersomnia, unspecified: Secondary | ICD-10-CM | POA: Diagnosis not present

## 2022-03-14 DIAGNOSIS — I4819 Other persistent atrial fibrillation: Secondary | ICD-10-CM

## 2022-03-14 DIAGNOSIS — I10 Essential (primary) hypertension: Secondary | ICD-10-CM

## 2022-03-14 NOTE — Progress Notes (Signed)
Cardiology Office Note:    Date:  03/15/2022   ID:  Calvin [name], DOB August 23, 1955, MRN [id number]  PCP:  Waldemar Dickens, MD   [facility] Prov[facility] Cardiologist:  Sanda Klein, MD     Referring MD: Waldemar Dickens, MD   Chief Complaint  Patient presents with   Atrial Fibrillation    History of Present Illness:    Calvin [name] is a 66 y.o. male with a hx of persistent atrial fibrillation that began during an episode of severe diarrhea but required cardioversion (02/15/2022).  He feels completely recovered from his acute illness.  He has not had any palpitations and has a smart watch that has not shown any evidence of arrhythmia.  He wears it all the time.  Has a history of a calcium score of 0 by CT in March 2023.  He has treated hypertension and has GERD.  He does not have other known cardiac illness.  No valvular abnormalities were seen on his echocardiogram and he has normal overall right and left ventricular systolic function, although both atria are mildly dilated.  Officially, CHA2DS2-VASc score is only 1 (HTN), but he will be turning 67 in just a few months.  He is on anticoagulation with Eliquis and has not had any bleeding problems.  He has not had any palpitations but was not aware of the isolated ectopic beats that I heard on his physical exam. The patient specifically denies any chest pain at rest exertion, dyspnea at rest or with exertion, orthopnea, paroxysmal nocturnal dyspnea, syncope, focal neurological deficits, intermittent claudication, lower extremity edema, unexplained weight gain, cough, hemoptysis or wheezing.  He has noticed that it takes longer for bleeding to stop if he scratches himself when he does yard work, but otherwise has not had any bleeding problems and has not had any falls.   Past Medical History:  Diagnosis Date   Allergy    Family history of early CAD    GERD (gastroesophageal reflux disease)    Hypertension    Nasal turbinate  hypertrophy     Past Surgical History:  Procedure Laterality Date   ANKLE ARTHRODESIS  1980   bone spur-right   breast cyst removal Right    CARDIOVERSION N/A 02/15/2022   Procedure: CARDIOVERSION;  Surgeon: Donato Heinz, MD;  Location: [facility];  Service: Cardiovascular;  Laterality: N/A;   COLONOSCOPY     CYST EXCISION  1979   right chest   NASAL SEPTOPLASTY W/ TURBINOPLASTY Bilateral 08/22/2019   Procedure: NASAL SEPTOPLASTY WITH TURBINATE REDUCTION;  Surgeon: Jodi Marble, MD;  Location: [facility];  Service: ENT;  Laterality: Bilateral;   Testicular Cyst Right     Current Medications: Current Meds  Medication Sig   acetaminophen (TYLENOL) 500 MG tablet Take 500-1,000 mg by mouth 2 (two) times daily as needed for fever.   apixaban (ELIQUIS) 5 MG TABS tablet Take 1 tablet (5 mg total) by mouth 2 (two) times daily.   Cholecalciferol (VITAMIN D) 50 MCG (2000 UT) tablet Take 2,000 Units by mouth daily.   diltiazem (CARDIZEM CD) 180 MG 24 hr capsule Take 1 capsule (180 mg total) by mouth daily.   hydrochlorothiazide (HYDRODIURIL) 25 MG tablet Take 25 mg by mouth every morning.   lisinopril (ZESTRIL) 20 MG tablet Take 1 tablet (20 mg total) by mouth daily.   Multiple Vitamins-Minerals (MULTIVITAMIN WITH MINERALS) tablet Take 1 tablet by mouth every morning.   mupirocin ointment (BACTROBAN) 2 % Place 1 Application  into the nose daily as needed (bleeding).   omeprazole (PRILOSEC) 20 MG capsule Take 20 mg by mouth every morning.   oxymetazoline (AFRIN) 0.05 % nasal spray Place 1 spray into both nostrils at bedtime.     Allergies:   Codeine   Social History   Socioeconomic History   Marital status: Married    Spouse name: Not on file   Number of children: Not on file   Years of education: Not on file   Highest education level: Not on file  Occupational History   Not on file  Tobacco Use   Smoking status: Never   Smokeless tobacco: Never  Vaping  Use   Vaping Use: Never used  Substance and Sexual Activity   Alcohol use: Yes    Comment: occ   Drug use: No   Sexual activity: Not on file  Other Topics Concern   Not on file  Social History Narrative   Not on file   Social Determinants of Health   Financial Resource Strain: Not on file  Food Insecurity: Not on file  Transportation Needs: Not on file  Physical Activity: Not on file  Stress: Not on file  Social Connections: Not on file     Family History: The patient's family history includes Esophageal cancer in his father. There is no history of Colon cancer, Rectal cancer, Stomach cancer, or Pancreatic cancer.  ROS:   Please see the history of present illness.     All other systems reviewed and are negative.  EKGs/Labs/Other Studies Reviewed:    The following studies were reviewed today: ECHO 01/20/2022   1. Left ventricular ejection fraction, by estimation, is 55 to 60%. The  left ventricle has normal function. The left ventricle has no regional  wall motion abnormalities. Left ventricular diastolic parameters are  indeterminate.   2. Right ventricular systolic function is normal. The right ventricular  size is normal. Tricuspid regurgitation signal is inadequate for assessing  PA pressure.   3. Left atrial size was mildly dilated.   4. Right atrial size was mildly dilated.   5. The mitral valve is normal in structure. No evidence of mitral valve  regurgitation. No evidence of mitral stenosis.   6. The aortic valve is tricuspid. Aortic valve regurgitation is not  visualized. Aortic valve sclerosis is present, with no evidence of aortic  valve stenosis.   7. The inferior vena cava is normal in size with greater than 50%  respiratory variability, suggesting right atrial pressure of 3 mmHg.     ETT 08/20/19 Excellent exercise capacity (13.2 METS) Upsloping ST segment depression during stress that likely represents normal J point depression with exercise. No  evidence of ischemia   CT Cardiac Scoring 09/27/21 IMPRESSION: 1. Coronary calcium score is 0.  EKG:  EKG is  ordered today.  The ekg ordered today demonstrates sinus rhythm with a ventricular couplet, otw normal, QTc 425 ms  Recent Labs: 01/19/2022: B Natriuretic Peptide 62.5 01/20/2022: ALT 71; TSH 1.615 02/13/2022: BUN 17; Creatinine, Ser 1.37; Hemoglobin 17.4; Platelets 238; Potassium 4.4; Sodium 137  Recent Lipid Panel    Component Value Date/Time   CHOL 163 02/13/2022 1044   TRIG 98 02/13/2022 1044   HDL 41 02/13/2022 1044   CHOLHDL 4.0 02/13/2022 1044   LDLCALC 104 (H) 02/13/2022 1044     Risk Assessment/Calculations:    CHA2DS2-VASc Score = 1   This indicates a 0.6% annual risk of stroke. The patient's score is based upon: CHF History:  0 HTN History: 1 Diabetes History: 0 Stroke History: 0 Vascular Disease History: 0 Age Score: 0 Gender Score: 0                Physical Exam:    VS:  BP 102/72 (BP Location: Left Arm, Patient Position: Sitting, Cuff Size: Large)   Pulse 70   Ht '6\' 1"'$  (1.854 m)   Wt 246 lb 6.4 oz (111.8 kg)   SpO2 94%   BMI 32.51 kg/m     Wt Readings from Last 3 Encounters:  03/14/22 246 lb 6.4 oz (111.8 kg)  02/15/22 246 lb 11.1 oz (111.9 kg)  02/13/22 244 lb 3.2 oz (110.8 kg)     GEN: Mildly obese well nourished, well developed in no acute distress HEENT: Normal NECK: No JVD; No carotid bruits LYMPHATICS: No lymphadenopathy CARDIAC: Occasional isolated ectopy on a background of RRR, no murmurs, rubs, gallops RESPIRATORY:  Clear to auscultation without rales, wheezing or rhonchi  ABDOMEN: Soft, non-tender, non-distended MUSCULOSKELETAL:  No edema; No deformity  SKIN: Warm and dry NEUROLOGIC:  Alert and oriented x 3 PSYCHIATRIC:  Normal affect   ASSESSMENT:    1. Persistent atrial fibrillation ([place])   2. Hypersomnolence   3. Essential hypertension    PLAN:    In order of problems listed above:  Paroxysmal atrial  fibrillation: Although this occurred during acute illness with electrolyte imbalances, he does have biatrial dilation and is therefore at risk for arrhythmia recurrence.  Left atrial dilation can be explained by longstanding history of hypertension.   Possible obstructive sleep apnea: Right atrial dilation could be an expression of unrecognized obstructive sleep apnea.  He does snore and has obesity.  He has intermediate score on a test for daytime hypersomnolence.  We will schedule him for an outpatient sleep study.  We have discussed the direct relationship between weight and the overall burden of atrial arrhythmia.  Losing weight would have substantial benefit to reduce the likelihood of future atrial fibrillation. HTN: Blood pressure recorded in the office today was unusually low for him, usually his blood pressures run around 130/70.  No changes were made to his medications.           Medication Adjustments/Labs and Tests Ordered: Current medicines are reviewed at length with the patient today.  Concerns regarding medicines are outlined above.  Orders Placed This Encounter  Procedures   EKG 12-Lead   Split night study   No orders of the defined types were placed in this encounter.   Patient Instructions  Medication Instructions:  No changes *If you need a refill on your cardiac medications before your next appointment, please call your pharmacy*   Lab Work: None ordered If you have labs (blood work) drawn today and your tests are completely normal, you will receive your results only by: [place] (if you have MyChart) OR A paper copy in the mail If you have any lab test that is abnormal or we need to change your treatment, we will call you to review the results.   Testing/Procedures: Your physician has recommended that you have a sleep study. This test records several body functions during sleep, including: brain activity, eye movement, oxygen and carbon dioxide blood  levels, heart rate and rhythm, breathing rate and rhythm, the flow of air through your mouth and nose, snoring, body muscle movements, and chest and belly movement.  They will call you to set this up.    Follow-Up: At [facility], you and  your health needs are our priority.  As part of our continuing mission to provide you with exceptional heart care, we have created designated Provider Care Teams.  These Care Teams include your primary Cardiologist (physician) and Advanced Practice Providers (APPs -  Physician Assistants and Nurse Practitioners) who all work together to provide you with the care you need, when you need it.  We recommend signing up for the patient portal called "MyChart".  Sign up information is provided on this After Visit Summary.  MyChart is used to connect with patients for Virtual Visits (Telemedicine).  Patients are able to view lab/test results, encounter notes, upcoming appointments, etc.  Non-urgent messages can be sent to your provider as well.   To learn more about what you can do with MyChart, go to [email or web address].    Your next appointment:   12 month(s)  The format for your next appointment:   In Person  Provider:   Sanda Klein, MD         Signed, Sanda Klein, MD  03/15/2022 5:24 PM    [place]

## 2022-03-14 NOTE — Patient Instructions (Signed)
Medication Instructions:  No changes *If you need a refill on your cardiac medications before your next appointment, please call your pharmacy*   Lab Work: None ordered If you have labs (blood work) drawn today and your tests are completely normal, you will receive your results only by: [place] (if you have MyChart) OR A paper copy in the mail If you have any lab test that is abnormal or we need to change your treatment, we will call you to review the results.   Testing/Procedures: Your physician has recommended that you have a sleep study. This test records several body functions during sleep, including: brain activity, eye movement, oxygen and carbon dioxide blood levels, heart rate and rhythm, breathing rate and rhythm, the flow of air through your mouth and nose, snoring, body muscle movements, and chest and belly movement.  They will call you to set this up.    Follow-Up: At [facility], you and your health needs are our priority.  As part of our continuing mission to provide you with exceptional heart care, we have created designated Provider Care Teams.  These Care Teams include your primary Cardiologist (physician) and Advanced Practice Providers (APPs -  Physician Assistants and Nurse Practitioners) who all work together to provide you with the care you need, when you need it.  We recommend signing up for the patient portal called "MyChart".  Sign up information is provided on this After Visit Summary.  MyChart is used to connect with patients for Virtual Visits (Telemedicine).  Patients are able to view lab/test results, encounter notes, upcoming appointments, etc.  Non-urgent messages can be sent to your provider as well.   To learn more about what you can do with MyChart, go to [email or web address].    Your next appointment:   12 month(s)  The format for your next appointment:   In Person  Provider:   Sanda Klein, MD

## 2022-03-15 ENCOUNTER — Encounter: Payer: Self-pay | Admitting: Cardiovascular Disease

## 2022-03-24 ENCOUNTER — Telehealth: Payer: Self-pay | Admitting: *Deleted

## 2022-03-24 DIAGNOSIS — G4710 Hypersomnia, unspecified: Secondary | ICD-10-CM

## 2022-03-24 NOTE — Telephone Encounter (Signed)
Please switch to home sleep study

## 2022-03-24 NOTE — Telephone Encounter (Signed)
Received a call from [facility] denying split night sleep study. Ordering provider, Dr Sallyanne Kuster will be notified. Can switch to HST or call [phone] and do peer to peer.

## 2022-03-24 NOTE — Telephone Encounter (Signed)
Home sleep study ordered

## 2022-04-03 ENCOUNTER — Ambulatory Visit (HOSPITAL_BASED_OUTPATIENT_CLINIC_OR_DEPARTMENT_OTHER): Payer: 59 | Attending: Cardiovascular Disease | Admitting: Cardiovascular Disease

## 2022-04-03 DIAGNOSIS — G4733 Obstructive sleep apnea (adult) (pediatric): Secondary | ICD-10-CM | POA: Insufficient documentation

## 2022-04-03 DIAGNOSIS — G4710 Hypersomnia, unspecified: Secondary | ICD-10-CM | POA: Diagnosis present

## 2022-04-03 DIAGNOSIS — I4819 Other persistent atrial fibrillation: Secondary | ICD-10-CM

## 2022-04-16 ENCOUNTER — Encounter (HOSPITAL_BASED_OUTPATIENT_CLINIC_OR_DEPARTMENT_OTHER): Payer: Self-pay | Admitting: Cardiovascular Disease

## 2022-04-16 NOTE — Procedures (Signed)
      Patient Name: Calvin [name], Calvin [name] Date: 04/03/2022 Gender: Male D.O.B: 08-06-1955 Age (years): 66 Referring Provider: Dani Gobble Croitoru Height (inches): 74 Interpreting Physician: Shelva Majestic MD, ABSM Weight (lbs): 245 RPSGT: Jacolyn Reedy BMI: 31 MRN: [id number] Neck Size: 17.50  CLINICAL INFORMATION Sleep Study Type: HST  Indication for sleep study: Atrial fibrillation, snoring, HTN  Epworth Sleepiness Score: 1  SLEEP STUDY TECHNIQUE A multi-channel overnight portable sleep study was performed. The channels recorded were: nasal airflow, thoracic respiratory movement, and oxygen saturation with a pulse oximetry. Snoring was also monitored.  MEDICATIONS acetaminophen (TYLENOL) 500 MG tablet apixaban (ELIQUIS) 5 MG TABS tablet Cholecalciferol (VITAMIN D) 50 MCG (2000 UT) tablet diclofenac Sodium (VOLTAREN) 1 % GEL diltiazem (CARDIZEM CD) 180 MG 24 hr capsule hydrochlorothiazide (HYDRODIURIL) 25 MG tablet lisinopril (ZESTRIL) 20 MG tablet montelukast (SINGULAIR) 10 MG tablet Multiple Vitamins-Minerals (MULTIVITAMIN WITH MINERALS) tablet mupirocin ointment (BACTROBAN) 2 % omeprazole (PRILOSEC) 20 MG capsule oxymetazoline (AFRIN) 0.05 % nasal spray triamcinolone cream (KENALOG) 0.5 %  Patient self administered medications include: N/A.  SLEEP ARCHITECTURE Patient was studied for 364.4 minutes. The sleep efficiency was 100.0 % and the patient was supine for 0%. The arousal index was 0.0 per hour.  RESPIRATORY PARAMETERS The overall AHI was 14.7 per hour, with a central apnea index of 0 per hour.  The oxygen nadir was 77% during sleep. Time spent < 89% was 10.5 minutes.  CARDIAC DATA Mean heart rate during sleep was 59.8 bpm.  IMPRESSIONS - Mild -moderate obstructive sleep apnea occurred during this study (AHI  14.7/h). - Severe oxygen desaturation to a nadir of 77%. - Patient snored for 108.7 minutes (29.8%) during the sleep.  DIAGNOSIS -  Obstructive Sleep Apnea (G47.33) - Nocturnal Hypoxemia (G47.36)  RECOMMENDATIONS - Therapeutic CPAP titration to determine optimal pressure required to alleviate sleep disordered breathing. If unable to obtain an in-lab CPAP titration,  initiate a trial of Auto-PAP with EPR of 3 at 7 - 18 cm of water. - Effort should be made to optimize nasal and oropharyngeal patency. - Avoid alcohol, sedatives and other CNS depressants that may worsen sleep apnea and disrupt normal sleep architecture. - Sleep hygiene should be reviewed to assess factors that may improve sleep quality. - Weight management (BMI 31) and regular exercise should be initiated or continued. - Return to Sleep Center to discuss the results of this study   [Electronically signed] 04/16/2022 09:40 PM  Shelva Majestic MD, [facility], ABSM Diplomate, American Board of Sleep Medicine  NPI: [id number]  [place] PH: [phone]   FX: [phone] [place]

## 2022-04-20 ENCOUNTER — Telehealth: Payer: Self-pay | Admitting: *Deleted

## 2022-04-20 NOTE — Telephone Encounter (Signed)
-----   Message from Troy Sine, MD sent at 04/16/2022  9:44 PM EDT ----- Mariann Laster, please notify patient of the results of the sleep study.  If unable to obtain an in lab CPAP titration, initiate AutoPap as prescribed.

## 2022-04-20 NOTE — Telephone Encounter (Signed)
Patient notified of HST results and MD recommendations. He states that he is currently out of town and will call back when he returns. He wants to check with another sleep ? Place first.

## 2022-08-03 ENCOUNTER — Encounter: Payer: Self-pay | Admitting: Cardiovascular Disease

## 2022-08-07 ENCOUNTER — Encounter: Payer: Self-pay | Admitting: Cardiovascular Disease

## 2022-09-04 ENCOUNTER — Encounter: Payer: Self-pay | Admitting: Gastroenterology

## 2022-10-10 ENCOUNTER — Telehealth: Payer: Self-pay

## 2022-10-10 ENCOUNTER — Telehealth: Payer: Self-pay | Admitting: *Deleted

## 2022-10-10 ENCOUNTER — Encounter: Payer: Self-pay | Admitting: Gastroenterology

## 2022-10-10 ENCOUNTER — Ambulatory Visit: Payer: 59 | Admitting: Gastroenterology

## 2022-10-10 VITALS — Ht 73.0 in | Wt 266.0 lb

## 2022-10-10 DIAGNOSIS — Z860100 Personal history of colon polyps, unspecified: Secondary | ICD-10-CM | POA: Insufficient documentation

## 2022-10-10 DIAGNOSIS — Z7901 Long term (current) use of anticoagulants: Secondary | ICD-10-CM | POA: Insufficient documentation

## 2022-10-10 MED ORDER — NA SULFATE-K SULFATE-MG SULF 17.5-3.13-1.6 GM/177ML PO SOLN: 1.0000 | Freq: Once | ORAL | 0 refills | Status: AC

## 2022-10-10 NOTE — Progress Notes (Signed)
Assessment and plans noted ?

## 2022-10-10 NOTE — Telephone Encounter (Signed)
[facility] Pre-operative Risk Assessment     Request for surgical clearance:     Endoscopy Procedure  What type of surgery is being performed?     Colonoscopy   When is this surgery scheduled?     11/07/22  What type of clearance is required ?   Pharmacy  Are there any medications that need to be held prior to surgery and how long? Eliquis 2 days   Practice name and name of physician performing surgery?      [facility]  What is your office phone and fax number?      Phone- [phone]  [phone]  Anesthesia type (None, local, MAC, general) ?       MAC

## 2022-10-10 NOTE — Patient Instructions (Signed)
_______________________________________________________  If your blood pressure at your visit was 140/90 or greater, please contact your primary care physician to follow up on this.  _______________________________________________________  If you are age 65 or older, your body mass index should be between 23-30. Your Body mass index is 35.09 kg/m. If this is out of the aforementioned range listed, please consider follow up with your Primary Care Provider.  If you are age 41 or younger, your body mass index should be between 19-25. Your Body mass index is 35.09 kg/m. If this is out of the aformentioned range listed, please consider follow up with your Primary Care Provider.   You have been scheduled for a colonoscopy. Please follow written instructions given to you at your visit today.  Please pick up your prep supplies at the pharmacy within the next 1-3 days. If you use inhalers (even only as needed), please bring them with you on the day of your procedure.  ________________________________________________________  The [facility] providers would like to encourage you to use [facility] to communicate with providers for non-urgent requests or questions.  Due to long hold times on the telephone, sending your provider a message by [facility] may be a faster and more efficient way to get a response.  Please allow 48 business hours for a response.  Please remember that this is for non-urgent requests.   It was a pleasure to see you today!  Thank you for trusting me with your gastrointestinal care!

## 2022-10-10 NOTE — Telephone Encounter (Signed)
Pt has been scheduled for tele pre op appt 10/16/22 @ 2:20. Med rec and consent are done.

## 2022-10-10 NOTE — Telephone Encounter (Signed)
Primary Cardiologist:Mihai Croitoru, MD   Preoperative team, please contact this patient and set up a phone call appointment for further preoperative risk assessment. Please obtain consent and complete medication review. Thank you for your help.   I confirm that guidance regarding antiplatelet and oral anticoagulation therapy has been completed and, if necessary, noted below.  Emmaline Life, NP-C  10/10/2022, 12:59 PM [address], [address] Office [phone] Fax [phone]

## 2022-10-10 NOTE — Telephone Encounter (Signed)
Patient with diagnosis of afib on Eliquis for anticoagulation.    Procedure: colonoscopy Date of procedure: 11/07/22  CHA2DS2-VASc Score = 2  This indicates a 2.2% annual risk of stroke. The patient's score is based upon: CHF History: 0 HTN History: 1 Diabetes History: 0 Stroke History: 0 Vascular Disease History: 0 Age Score: 1 Gender Score: 0   CrCl 10mL/min using adj body weight Platelet count 238K  Per office protocol, patient can hold Eliquis for 2 days prior to procedure.    **This guidance is not considered finalized until pre-operative APP has relayed final recommendations.**

## 2022-10-10 NOTE — Telephone Encounter (Signed)
Pt has been scheduled for tele pre op appt 10/16/22 @ 2:20. Med rec and consent are done.     Patient Consent for Virtual Visit        Calvin Calvin [name] has provided verbal consent on 10/10/2022 for a virtual visit (video or telephone).   CONSENT FOR VIRTUAL VISIT FOR:  Calvin Calvin [name]  By participating in this virtual visit I agree to the following:  I hereby voluntarily request, consent and authorize [facility] and its employed or contracted physicians, physician assistants, nurse practitioners or other licensed health care professionals (the Practitioner), to provide me with telemedicine health care services (the "Services") as deemed necessary by the treating Practitioner. I acknowledge and consent to receive the Services by the Practitioner via telemedicine. I understand that the telemedicine visit will involve communicating with the Practitioner through live audiovisual communication technology and the disclosure of certain medical information by electronic transmission. I acknowledge that I have been given the opportunity to request an in-person assessment or other available alternative prior to the telemedicine visit and am voluntarily participating in the telemedicine visit.  I understand that I have the right to withhold or withdraw my consent to the use of telemedicine in the course of my care at any time, without affecting my right to future care or treatment, and that the Practitioner or I may terminate the telemedicine visit at any time. I understand that I have the right to inspect all information obtained and/or recorded in the course of the telemedicine visit and may receive copies of available information for a reasonable Calvin [name].  I understand that some of the potential risks of receiving the Services via telemedicine include:  Delay or interruption in medical evaluation due to technological equipment failure or disruption; Information transmitted may not be sufficient (e.g.  poor resolution of images) to allow for appropriate medical decision making by the Practitioner; and/or  In rare instances, security protocols could fail, causing a breach of personal health information.  Furthermore, I acknowledge that it is my responsibility to provide information about my medical history, conditions and care that is complete and accurate to the best of my ability. I acknowledge that Practitioner's advice, recommendations, and/or decision may be based on factors not within their control, such as incomplete or inaccurate data provided by me or distortions of diagnostic images or specimens that may result from electronic transmissions. I understand that the practice of medicine is not an exact science and that Practitioner makes no warranties or guarantees regarding treatment outcomes. I acknowledge that a copy of this consent can be made available to me via my patient portal ([place]), or I can request a printed copy by calling the office of [facility].    I understand that my insurance will be billed for this visit.   I have read or had this consent read to me. I understand the contents of this consent, which adequately explains the benefits and risks of the Services being provided via telemedicine.  I have been provided ample opportunity to ask questions regarding this consent and the Services and have had my questions answered to my satisfaction. I give my informed consent for the services to be provided through the use of telemedicine in my medical care

## 2022-10-10 NOTE — Progress Notes (Signed)
10/10/2022 Calvin [name] [id number] 04/02/1956   HISTORY OF PRESENT ILLNESS: This is a 67 year old male who is a patient of Dr. Henrene Pastor is known to him only for colonoscopy.  He had a colonoscopy in 2008 that was normal without polyps.  Colonoscopy in July 2018 showed a single 4 mm tubular adenoma that was removed.  He is here today to discuss colonoscopy.  He is on Eliquis since July 2023.  He said that he had gotten sick with rotavirus and E. coli strain that caused him to be hospitalized due to dehydration.  That kicked him into A-fib.  They cardioverted him and he has not had any issues with atrial fibrillation since that time.  They wanted to leave him on Eliquis for a year.  His cardiologist is Dr. Sallyanne Kuster.  He denies any GI complaints or issues.  Moves his bowels regularly.  No rectal bleeding.   Past Medical History:  Diagnosis Date   Allergy    Atrial fibrillation ([place])    Family history of early CAD    GERD (gastroesophageal reflux disease)    Hypertension    Nasal turbinate hypertrophy    Past Surgical History:  Procedure Laterality Date   ANKLE ARTHRODESIS  1980   bone spur-right   breast cyst removal Right    CARDIOVERSION N/A 02/15/2022   Procedure: CARDIOVERSION;  Surgeon: Donato Heinz, MD;  Location: [facility];  Service: Cardiovascular;  Laterality: N/A;   COLONOSCOPY     CYST EXCISION  1979   right chest   NASAL SEPTOPLASTY W/ TURBINOPLASTY Bilateral 08/22/2019   Procedure: NASAL SEPTOPLASTY WITH TURBINATE REDUCTION;  Surgeon: Jodi Marble, MD;  Location: [facility];  Service: ENT;  Laterality: Bilateral;   Testicular Cyst Right     reports that he has never smoked. He has never used smokeless tobacco. He reports current alcohol use. He reports that he does not use drugs. family history includes Esophageal cancer in his father; Heart disease in his paternal grandfather. Allergies  Allergen Reactions   Codeine Itching       Outpatient Encounter Medications as of 10/10/2022  Medication Sig   acetaminophen (TYLENOL) 500 MG tablet Take 500-1,000 mg by mouth 2 (two) times daily as needed for fever.   apixaban (ELIQUIS) 5 MG TABS tablet Take 1 tablet (5 mg total) by mouth 2 (two) times daily.   Cholecalciferol (VITAMIN D) 50 MCG (2000 UT) tablet Take 2,000 Units by mouth daily.   diltiazem (CARDIZEM CD) 180 MG 24 hr capsule Take 1 capsule (180 mg total) by mouth daily.   lisinopril (ZESTRIL) 20 MG tablet Take 1 tablet (20 mg total) by mouth daily.   montelukast (SINGULAIR) 10 MG tablet Take 10 mg by mouth daily as needed (seasonal allergies).   Multiple Vitamins-Minerals (MULTIVITAMIN WITH MINERALS) tablet Take 1 tablet by mouth every morning.   mupirocin ointment (BACTROBAN) 2 % Place 1 Application into the nose daily as needed (bleeding).   omeprazole (PRILOSEC) 20 MG capsule Take 20 mg by mouth every morning.   [DISCONTINUED] diclofenac Sodium (VOLTAREN) 1 % GEL Apply 1 Application topically daily as needed (joint pain). (Patient not taking: Reported on 03/14/2022)   [DISCONTINUED] hydrochlorothiazide (HYDRODIURIL) 25 MG tablet Take 25 mg by mouth every morning.   [DISCONTINUED] oxymetazoline (AFRIN) 0.05 % nasal spray Place 1 spray into both nostrils at bedtime.   [DISCONTINUED] triamcinolone cream (KENALOG) 0.5 % Apply 1 Application topically 2 (two) times daily as needed (poison ivy). (Patient  not taking: Reported on 03/14/2022)   No facility-administered encounter medications on file as of 10/10/2022.     REVIEW OF SYSTEMS  : All other systems reviewed and negative except where noted in the History of Present Illness.   PHYSICAL EXAM: Ht 6\' 1"  (1.854 m)   Wt 266 lb (120.7 kg)   BMI 35.09 kg/m  General: Well developed white male in no acute distress Head: Normocephalic and atraumatic Eyes:  Sclerae anicteric, conjunctiva pink. Ears: Normal auditory acuity Lungs: Clear throughout to auscultation; no  W/R/R. Heart: Regular rate and rhythm; no M/R/G. Rectal:  Will be done at the time of colonoscopy. Musculoskeletal: Symmetrical with no gross deformities  Skin: No lesions on visible extremities Extremities: No edema.  Right foot with a orthopedic boot in place.  Neurological: Alert oriented x 4, grossly non-focal Psychological:  Alert and cooperative. Normal mood and affect  ASSESSMENT AND PLAN: *Personal history of colon polyps: Colonoscopy in 2008 without any polyps.  Colonoscopy in 2018 with a single 4 mm tubular adenoma removed.  With new guidelines he could technically wait until July 2025 to have his colonoscopy.  We discussed this.  He says that he is retiring this summer and likely relocating back to [place] where he is from.  He would like to go ahead and get t out of the way here before relocating, etc.  Will schedule Dr. Henrene Pastor. *Chronic anticoagulation:  Will hold Eliquis for 2 days prior to endoscopic procedures - will instruct when and how to resume after procedure. Benefits and risks of procedure explained including risks of bleeding, perforation, infection, missed lesions, reactions to medications and possible need for hospitalization and surgery for complications. Additional rare but real risk of stroke or other vascular clotting events off of Eliquis also explained and need to seek urgent help if any signs of these problems occur. Will communicate by phone or EMR with patient's prescribing provider, Dr. Sallyanne Kuster, to confirm that holding Eliquis is reasonable in this case.     CC:  Waldemar Dickens, MD

## 2022-10-16 ENCOUNTER — Ambulatory Visit: Payer: 59 | Attending: Internal Medicine

## 2022-10-16 DIAGNOSIS — Z01810 Encounter for preprocedural cardiovascular examination: Secondary | ICD-10-CM

## 2022-10-16 NOTE — Progress Notes (Signed)
Virtual Visit via Telephone Note   Because of Calvin [name]'s co-morbid illnesses, he is at least at moderate risk for complications without adequate follow up.  This format is felt to be most appropriate for this patient at this time.  The patient did not have access to video technology/had technical difficulties with video requiring transitioning to audio format only (telephone).  All issues noted in this document were discussed and addressed.  No physical exam could be performed with this format.  Please refer to the patient's chart for his consent to telehealth for [facility].  Evaluation Performed:  Preoperative cardiovascular risk assessment _____________   Date:  10/16/2022   Patient ID:  Calvin [name], DOB [birth year], MRN [id number] Patient Location:  Home Provider location:   Office  Primary Care Provider:  Waldemar Dickens, MD Primary Cardiologist:  Calvin Klein, MD  Chief Complaint / Patient Profile   67 y.o. y/o male with a h/o PAF, GERD, HTN who is pending colonoscopy and presents today for telephonic preoperative cardiovascular risk assessment.  History of Present Illness    Calvin [name] is a 67 y.o. male who presents via audio/video conferencing for a telehealth visit today.  Pt was last seen in cardiology clinic on 03/14/22 by Dr. Sallyanne [name].  At that time Calvin [name] was doing well with no complaints of chest pain or known palpitations. The patient is now pending procedure as outlined above. Since his last visit, he is doing well with no new cardiac complaints since his visit.  He recently had a full physical by his PCP that was really good and he wears a Apple Watch which shows no evidence of atrial fibrillation  He denies chest pain, shortness of breath, lower extremity edema, fatigue, palpitations, melena, hematuria, hemoptysis, diaphoresis, weakness, presyncope, syncope, orthopnea, and PND.    Past Medical History    Past Medical History:   Diagnosis Date   Allergy    Atrial fibrillation ([place])    Family history of early CAD    GERD (gastroesophageal reflux disease)    Hypertension    Nasal turbinate hypertrophy    Past Surgical History:  Procedure Laterality Date   ANKLE ARTHRODESIS  1980   bone spur-right   breast cyst removal Right    CARDIOVERSION N/A 02/15/2022   Procedure: CARDIOVERSION;  Surgeon: Donato Heinz, MD;  Location: [facility];  Service: Cardiovascular;  Laterality: N/A;   COLONOSCOPY     CYST EXCISION  1979   right chest   NASAL SEPTOPLASTY W/ TURBINOPLASTY Bilateral 08/22/2019   Procedure: NASAL SEPTOPLASTY WITH TURBINATE REDUCTION;  Surgeon: Jodi Marble, MD;  Location: [facility];  Service: ENT;  Laterality: Bilateral;   Testicular Cyst Right     Allergies  Allergies  Allergen Reactions   Codeine Itching    Home Medications    Prior to Admission medications   Medication Sig Start Date End Date Taking? Authorizing Provider  acetaminophen (TYLENOL) 500 MG tablet Take 500-1,000 mg by mouth 2 (two) times daily as needed for fever.    [provider]  apixaban (ELIQUIS) 5 MG TABS tablet Take 1 tablet (5 mg total) by mouth 2 (two) times daily. 01/27/22 01/28/23  Swinyer, Lanice Schwab, NP  Cholecalciferol (VITAMIN D) 50 MCG (2000 UT) tablet Take 2,000 Units by mouth daily.    [provider]  diltiazem (CARDIZEM CD) 180 MG 24 hr capsule Take 1 capsule (180 mg total) by mouth daily. 01/27/22 01/28/23  Swinyer, Lanice Schwab, NP  lisinopril (ZESTRIL) 20 MG tablet Take 1 tablet (20 mg total) by mouth daily. 02/20/22   Croitoru, Mihai, MD  montelukast (SINGULAIR) 10 MG tablet Take 10 mg by mouth daily as needed (seasonal allergies). 11/07/21   [provider]  Multiple Vitamins-Minerals (MULTIVITAMIN WITH MINERALS) tablet Take 1 tablet by mouth every morning.    [provider]  mupirocin ointment (BACTROBAN) 2 % Place 1 Application into the nose daily  as needed (bleeding). 02/07/22   [provider]  omeprazole (PRILOSEC) 20 MG capsule Take 20 mg by mouth every morning.    [provider]    Physical Exam    Vital Signs:  Calvin [name] does not have vital signs available for review today.  Given telephonic nature of communication, physical exam is limited. AAOx3. NAD. Normal affect.  Speech and respirations are unlabored.  Accessory Clinical Findings    None  Assessment & Plan    1.  Preoperative Cardiovascular Risk Assessment:  Calvin [name]'s perioperative risk of a major cardiac event is 0.4% according to the Revised Cardiac Risk Index (RCRI).  Therefore, he is at low risk for perioperative complications.   His functional capacity is good at 4.31 METs according to the Duke Activity Status Index (DASI). Recommendations: According to ACC/AHA guidelines, no further cardiovascular testing needed.  The patient may proceed to surgery at acceptable risk.   Antiplatelet and/or Anticoagulation Recommendations:  Eliquis (Apixaban) can be held for 2 days prior to surgery.  Please resume post op when felt to be safe.      The patient was advised that if he develops new symptoms prior to surgery to contact our office to arrange for a follow-up visit, and he verbalized understanding.   A copy of this note will be routed to requesting surgeon.  Time:   Today, I have spent 6 minutes with the patient with telehealth technology discussing medical history, symptoms, and management plan.     Mable Fill, Marissa Nestle, NP  10/16/2022, 8:06 AM

## 2022-10-17 NOTE — Telephone Encounter (Signed)
Called patient and informed him to hold Eliquis 2 days prior to his procedure.

## 2022-10-24 ENCOUNTER — Other Ambulatory Visit: Payer: Self-pay | Admitting: Family Medicine

## 2022-10-24 ENCOUNTER — Ambulatory Visit
Admission: RE | Admit: 2022-10-24 | Discharge: 2022-10-24 | Disposition: A | Discharge status: Home / Self Care | Payer: 59 | Source: Ambulatory Visit | Attending: Family Medicine | Admitting: Family Medicine

## 2022-10-24 DIAGNOSIS — M79675 Pain in left toe(s): Secondary | ICD-10-CM

## 2022-10-24 NOTE — Progress Notes (Signed)
Toe pain. Acute onset at night while ambulating. Ongoing x4 days. Associated with swelling nad erythema. No known trauma

## 2022-10-27 ENCOUNTER — Encounter: Payer: Self-pay | Admitting: Internal Medicine

## 2022-11-07 ENCOUNTER — Encounter: Payer: Self-pay | Admitting: Internal Medicine

## 2022-11-07 ENCOUNTER — Ambulatory Visit (AMBULATORY_SURGERY_CENTER): Payer: 59 | Admitting: Internal Medicine

## 2022-11-07 VITALS — BP 107/67 | HR 72 | Temp 98.4°F | Resp 19 | Ht 73.0 in | Wt 266.0 lb

## 2022-11-07 DIAGNOSIS — Z86010 Personal history of colonic polyps: Secondary | ICD-10-CM

## 2022-11-07 DIAGNOSIS — D122 Benign neoplasm of ascending colon: Secondary | ICD-10-CM

## 2022-11-07 DIAGNOSIS — D123 Benign neoplasm of transverse colon: Secondary | ICD-10-CM

## 2022-11-07 DIAGNOSIS — D124 Benign neoplasm of descending colon: Secondary | ICD-10-CM

## 2022-11-07 DIAGNOSIS — Z09 Encounter for follow-up examination after completed treatment for conditions other than malignant neoplasm: Secondary | ICD-10-CM | POA: Diagnosis not present

## 2022-11-07 DIAGNOSIS — K635 Polyp of colon: Secondary | ICD-10-CM

## 2022-11-07 MED ORDER — SODIUM CHLORIDE 0.9 % IV SOLN: 500.0000 mL | Freq: Once | INTRAVENOUS | Status: DC

## 2022-11-07 NOTE — Progress Notes (Signed)
Vss nad trans to pacu 

## 2022-11-07 NOTE — Progress Notes (Signed)
10/10/2022 Calvin [name] [id number] 1955/08/23     HISTORY OF PRESENT ILLNESS: This is a 67 year old male who is a patient of Dr. Marina Goodell is known to him only for colonoscopy.  He had a colonoscopy in 2008 that was normal without polyps.  Colonoscopy in July 2018 showed a single 4 mm tubular adenoma that was removed.  He is here today to discuss colonoscopy.  He is on Eliquis since July 2023.  He said that he had gotten sick with rotavirus and E. coli strain that caused him to be hospitalized due to dehydration.  That kicked him into A-fib.  They cardioverted him and he has not had any issues with atrial fibrillation since that time.  They wanted to leave him on Eliquis for a year.  His cardiologist is Dr. Royann Shivers.  He denies any GI complaints or issues.  Moves his bowels regularly.  No rectal bleeding.         Past Medical History:  Diagnosis Date   Allergy     Atrial fibrillation ([facility])     Family history of early CAD     GERD (gastroesophageal reflux disease)     Hypertension     Nasal turbinate hypertrophy           Past Surgical History:  Procedure Laterality Date   ANKLE ARTHRODESIS   1980    bone spur-right   breast cyst removal Right     CARDIOVERSION N/A 02/15/2022    Procedure: CARDIOVERSION;  Surgeon: Little Ishikawa, MD;  Location: [facility];  Service: Cardiovascular;  Laterality: N/A;   COLONOSCOPY       CYST EXCISION   1979    right chest   NASAL SEPTOPLASTY W/ TURBINOPLASTY Bilateral 08/22/2019    Procedure: NASAL SEPTOPLASTY WITH TURBINATE REDUCTION;  Surgeon: Flo Shanks, MD;  Location: [facility];  Service: ENT;  Laterality: Bilateral;   Testicular Cyst Right       reports that he has never smoked. He has never used smokeless tobacco. He reports current alcohol use. He reports that he does not use drugs. family history includes Esophageal cancer in his father; Heart disease in his paternal grandfather.     Allergies  Allergen  Reactions   Codeine Itching            Outpatient Encounter Medications as of 10/10/2022  Medication Sig   acetaminophen (TYLENOL) 500 MG tablet Take 500-1,000 mg by mouth 2 (two) times daily as needed for fever.   apixaban (ELIQUIS) 5 MG TABS tablet Take 1 tablet (5 mg total) by mouth 2 (two) times daily.   Cholecalciferol (VITAMIN D) 50 MCG (2000 UT) tablet Take 2,000 Units by mouth daily.   diltiazem (CARDIZEM CD) 180 MG 24 hr capsule Take 1 capsule (180 mg total) by mouth daily.   lisinopril (ZESTRIL) 20 MG tablet Take 1 tablet (20 mg total) by mouth daily.   montelukast (SINGULAIR) 10 MG tablet Take 10 mg by mouth daily as needed (seasonal allergies).   Multiple Vitamins-Minerals (MULTIVITAMIN WITH MINERALS) tablet Take 1 tablet by mouth every morning.   mupirocin ointment (BACTROBAN) 2 % Place 1 Application into the nose daily as needed (bleeding).   omeprazole (PRILOSEC) 20 MG capsule Take 20 mg by mouth every morning.   [DISCONTINUED] diclofenac Sodium (VOLTAREN) 1 % GEL Apply 1 Application topically daily as needed (joint pain). (Patient not taking: Reported on 03/14/2022)   [DISCONTINUED] hydrochlorothiazide (HYDRODIURIL) 25 MG tablet Take 25  mg by mouth every morning.   [DISCONTINUED] oxymetazoline (AFRIN) 0.05 % nasal spray Place 1 spray into both nostrils at bedtime.   [DISCONTINUED] triamcinolone cream (KENALOG) 0.5 % Apply 1 Application topically 2 (two) times daily as needed (poison ivy). (Patient not taking: Reported on 03/14/2022)    No facility-administered encounter medications on file as of 10/10/2022.        REVIEW OF SYSTEMS  : All other systems reviewed and negative except where noted in the History of Present Illness.     PHYSICAL EXAM: Ht  (1.854 m)   Wt 266 lb (120.7 kg)   BMI 35.09 kg/m  General: Well developed white male in no acute distress Head: Normocephalic and atraumatic Eyes:  Sclerae anicteric, conjunctiva pink. Ears: Normal auditory  acuity Lungs: Clear throughout to auscultation; no W/R/R. Heart: Regular rate and rhythm; no M/R/G. Rectal:  Will be done at the time of colonoscopy. Musculoskeletal: Symmetrical with no gross deformities  Skin: No lesions on visible extremities Extremities: No edema.  Right foot with a orthopedic boot in place.  Neurological: Alert oriented x 4, grossly non-focal Psychological:  Alert and cooperative. Normal mood and affect   ASSESSMENT AND PLAN: *Personal history of colon polyps: Colonoscopy in 2008 without any polyps.  Colonoscopy in 2018 with a single 4 mm tubular adenoma removed.  With new guidelines he could technically wait until July 2025 to have his colonoscopy.  We discussed this.  He says that he is retiring this summer and likely relocating back to [place] where he is from.  He would like to go ahead and get t out of the way here before relocating, etc.  Will schedule Dr. Marina Goodell. *Chronic anticoagulation:  Will hold Eliquis for 2 days prior to endoscopic procedures - will instruct when and how to resume after procedure. Benefits and risks of procedure explained including risks of bleeding, perforation, infection, missed lesions, reactions to medications and possible need for hospitalization and surgery for complications. Additional rare but real risk of stroke or other vascular clotting events off of Eliquis also explained and need to seek urgent help if any signs of these problems occur. Will communicate by phone or EMR with patient's prescribing provider, Dr. Royann Shivers, to confirm that holding Eliquis is reasonable in this case.

## 2022-11-07 NOTE — Op Note (Signed)
Calvin [name] Patient Name: Calvin [name] Procedure Date: 11/07/2022 1:28 PM MRN: [id number] Endoscopist: Wilhemina Bonito. Marina Goodell , MD, [id number] Age: 67 Referring MD:  Date of Birth: 04-Dec-1955 Gender: Male Account #: [id number] Procedure:                Colonoscopy with cold snare polypectomy x 3; biopsy                            polypectomy x 1 Indications:              High risk colon cancer surveillance: Personal                            history of non-advanced adenoma. Previous                            examinations 2008, 2018 Medicines:                Monitored Anesthesia Care Procedure:                Pre-Anesthesia Assessment:                           - Prior to the procedure, a History and Physical                            was performed, and patient medications and                            allergies were reviewed. The patient's tolerance of                            previous anesthesia was also reviewed. The risks                            and benefits of the procedure and the sedation                            options and risks were discussed with the patient.                            All questions were answered, and informed consent                            was obtained. Prior Anticoagulants: The patient has                            taken Eliquis (apixaban), last dose was 3 days                            prior to procedure. ASA Grade Assessment: III - A                            patient with severe systemic disease. After  reviewing the risks and benefits, the patient was                            deemed in satisfactory condition to undergo the                            procedure.                           After obtaining informed consent, the colonoscope                            was passed under direct vision. Throughout the                            procedure, the patient's blood pressure, pulse, and                             oxygen saturations were monitored continuously. The                            Olympus CF-HQ190L ([id number]) Colonoscope was                            introduced through the anus and advanced to the the                            cecum, identified by appendiceal orifice and                            ileocecal valve. The ileocecal valve, appendiceal                            orifice, and rectum were photographed. The quality                            of the bowel preparation was excellent. The                            colonoscopy was performed without difficulty. The                            patient tolerated the procedure well. The bowel                            preparation used was SUPREP via split dose                            instruction. Scope In: 1:39:35 PM Scope Out: 1:54:03 PM Scope Withdrawal Time: 0 hours 12 minutes 55 seconds  Total Procedure Duration: 0 hours 14 minutes 28 seconds  Findings:                 A less than 1 mm polyp was found in the transverse  colon. The polyp was removed with a jumbo cold                            forceps. Resection and retrieval were complete.                           Three polyps were found in the descending colon and                            ascending colon. The polyps were 2 to 3 mm in size.                            These polyps were removed with a cold snare.                            Resection and retrieval were complete.                           Multiple diverticula were found in the left colon                            and right colon.                           Internal hemorrhoids were found during retroflexion.                           The exam was otherwise without abnormality on                            direct and retroflexion views. Complications:            No immediate complications. Estimated blood loss:                            None. Estimated Blood Loss:     Estimated  blood loss: none. Impression:               - One less than 1 mm polyp in the transverse colon,                            removed with a jumbo cold forceps. Resected and                            retrieved.                           - Three 2 to 3 mm polyps in the descending colon                            and in the ascending colon, removed with a cold                            snare. Resected and retrieved.                           -  Diverticulosis in the left colon and in the right                            colon.                           - Internal hemorrhoids.                           - The examination was otherwise normal on direct                            and retroflexion views. Recommendation:           - Repeat colonoscopy in 5-10 years for                            surveillance, based on final pathology.                           - Resume Eliquis (apixaban) today at prior dose.                           - Patient has a contact number available for                            emergencies. The signs and symptoms of potential                            delayed complications were discussed with the                            patient. Return to normal activities tomorrow.                            Written discharge instructions were provided to the                            patient.                           - Resume previous diet.                           - Continue present medications.                           - Await pathology results. Wilhemina Bonito. Marina Goodell, MD 11/07/2022 2:06:02 PM This report has been signed electronically.

## 2022-11-07 NOTE — Progress Notes (Signed)
Pt's states no medical or surgical changes since previsit or office visit. 

## 2022-11-07 NOTE — Progress Notes (Signed)
Called to room to assist during endoscopic procedure.  Patient ID and intended procedure confirmed with present staff. Received instructions for my participation in the procedure from the performing physician.  

## 2022-11-07 NOTE — Patient Instructions (Signed)
YOU HAD AN ENDOSCOPIC PROCEDURE TODAY AT THE [facility]:   Refer to the procedure report that was given to you for any specific questions about what was found during the examination.  If the procedure report does not answer your questions, please call your gastroenterologist to clarify.  If you requested that your care partner not be given the details of your procedure findings, then the procedure report has been included in a sealed envelope for you to review at your convenience later.  **Handout given on polyps**  YOU SHOULD EXPECT: Some feelings of bloating in the abdomen. Passage of more gas than usual.  Walking can help get rid of the air that was put into your GI tract during the procedure and reduce the bloating. If you had a lower endoscopy (such as a colonoscopy or flexible sigmoidoscopy) you may notice spotting of blood in your stool or on the toilet paper. If you underwent a bowel prep for your procedure, you may not have a normal bowel movement for a few days.  Please Note:  You might notice some irritation and congestion in your nose or some drainage.  This is from the oxygen used during your procedure.  There is no need for concern and it should clear up in a day or so.  SYMPTOMS TO REPORT IMMEDIATELY:  Following lower endoscopy (colonoscopy or flexible sigmoidoscopy):  Excessive amounts of blood in the stool  Significant tenderness or worsening of abdominal pains  Swelling of the abdomen that is new, acute  Fever of 100F or higher  For urgent or emergent issues, a gastroenterologist can be reached at any hour by calling [phone]. Do not use MyChart messaging for urgent concerns.    DIET:  We do recommend a small meal at first, but then you may proceed to your regular diet.  Drink plenty of fluids but you should avoid alcoholic beverages for 24 hours.  ACTIVITY:  You should plan to take it easy for the rest of today and you should NOT DRIVE or use heavy  machinery until tomorrow (because of the sedation medicines used during the test).    FOLLOW UP: Our staff will call the number listed on your records the next business day following your procedure.  We will call around 7:15- 8:00 am to check on you and address any questions or concerns that you may have regarding the information given to you following your procedure. If we do not reach you, we will leave a message.     If any biopsies were taken you will be contacted by phone or by letter within the next 1-3 weeks.  Please call us at [phone] if you have not heard about the biopsies in 3 weeks.    SIGNATURES/CONFIDENTIALITY: You and/or your care partner have signed paperwork which will be entered into your electronic medical record.  These signatures attest to the fact that that the information above on your After Visit Summary has been reviewed and is understood.  Full responsibility of the confidentiality of this discharge information lies with you and/or your care-partner.  

## 2022-11-08 ENCOUNTER — Telehealth: Payer: Self-pay

## 2022-11-08 NOTE — Telephone Encounter (Signed)
  Follow up Call-     11/07/2022    1:07 PM  Call back number  Post procedure Call Back phone  # [phone]  Permission to leave phone message Yes     Patient questions:  Do you have a fever, pain , or abdominal swelling? No. Pain Score  0 *  Have you tolerated food without any problems? Yes.    Have you been able to return to your normal activities? Yes.    Do you have any questions about your discharge instructions: Diet   No. Medications  No. Follow up visit  No.  Do you have questions or concerns about your Care? No.  Actions: * If pain score is 4 or above: No action needed, pain <4.

## 2022-11-27 ENCOUNTER — Encounter: Payer: Self-pay | Admitting: Internal Medicine

## 2023-01-03 ENCOUNTER — Encounter: Payer: Self-pay | Admitting: Physician Assistant

## 2023-01-03 ENCOUNTER — Ambulatory Visit: Payer: 59 | Attending: Physician Assistant | Admitting: Physician Assistant

## 2023-01-03 VITALS — BP 126/84 | HR 74 | Ht 74.0 in | Wt 264.8 lb

## 2023-01-03 DIAGNOSIS — I10 Essential (primary) hypertension: Secondary | ICD-10-CM | POA: Diagnosis not present

## 2023-01-03 DIAGNOSIS — I4819 Other persistent atrial fibrillation: Secondary | ICD-10-CM

## 2023-01-03 NOTE — Progress Notes (Signed)
Cardiology Office Note:  .   Date:  01/03/2023  ID:  Micheline Maze, DOB [birth year], MRN [id number] PCP: Ozella Rocks, MD  [facility] Providers Cardiologist:  Thurmon Fair, MD     History of Present Illness: .   Kirubel Aja is a 67 y.o. male with past medical history of persistent atrial fibrillation, hypertension and GERD.  He had atrial fibrillation following severe diarrhea with positive rotavirus and E. coli that required cardioversion in August 2023.  Echocardiogram obtained on 01/20/2022 showed EF 55 to 60%, no regional wall motion abnormality, mild biatrial enlargement.  Prior to that, he has a history of coronary calcium score of 0 on CT in March 2023 and normal ETT in February 2021.  He has been placed on Eliquis.  There has been no significant recurrence of atrial fibrillation.  He was last seen by Dr. Royann Shivers on 03/14/2022 at which time he was doing well.  It was recommended for him to undergo sleep study.  He did undergo home sleep study, Dr. Tresa Endo recommended CPAP titration or AutoPap.  Based on telephone note from January, HCTZ was changed to as needed rather than scheduled.   Patient presents today for follow-up.  He is moving to [place] in July.  He denies any recent recurrence of atrial fibrillation.  His Apple Watch has not alerted him of any irregular heartbeat.  He gets a rhythm strip from his Apple Watch on a daily basis.  He denies any exertional chest pain or worsening shortness of breath.  He mentioned that Dr. Royann Shivers was thinking about taking him off of Eliquis after a year if he does not have any recurrence.  He wished to know if that is still the case.  I will check with Dr. Royann Shivers and contact him tomorrow around lunchtime.  Overall he is doing well from the cardiac perspective.  Note, I discussed the case with Dr. Royann Shivers, given lack of recurrence of atrial fibrillation, it would be reasonable to stop the Eliquis.  He will continue to monitor for  recurrence of A-fib on his Apple Watch.  He would establish with a cardiologist when he moved to [place].  ROS:   He denies chest pain, palpitations, dyspnea, pnd, orthopnea, n, v, dizziness, syncope, edema, weight gain, or early satiety. All other systems reviewed and are otherwise negative except as noted above.    Studies Reviewed: .        Cardiac Studies & Procedures     STRESS TESTS  EXERCISE TOLERANCE TEST (ETT) 08/20/2019  Narrative  Excellent exercise capacity (13.2 METS)  Upsloping ST segment depression during stress that likely represents normal J point depression with exercise. No evidence of ischemia   ECHOCARDIOGRAM  ECHOCARDIOGRAM COMPLETE 01/20/2022  Narrative ECHOCARDIOGRAM REPORT    Patient Name:   Demetria Kozel Date of Exam: 01/20/2022 Medical Rec #:  [id number]       Height:       73.0 in Accession #:    [id number]      Weight:       250.0 lb Date of Birth:  October 13, 1955        BSA:          2.366 m Patient Age:    66 years        BP:           133/91 mmHg Patient Gender: M               HR:  73 bpm. Exam Location:  Inpatient  Procedure: 2D Echo, Cardiac Doppler and Color Doppler  Indications:    Atrial Fibrillation  History:        Patient has prior history of Echocardiogram examinations, most recent 08/12/2019. CAD, Arrythmias:Atrial Fibrillation; Risk Factors:Hypertension.  Sonographer:    Mikki Harbor Referring Phys: [id number] Delila Pereyra A KYLE  IMPRESSIONS   1. Left ventricular ejection fraction, by estimation, is 55 to 60%. The left ventricle has normal function. The left ventricle has no regional wall motion abnormalities. Left ventricular diastolic parameters are indeterminate. 2. Right ventricular systolic function is normal. The right ventricular size is normal. Tricuspid regurgitation signal is inadequate for assessing PA pressure. 3. Left atrial size was mildly dilated. 4. Right atrial size was mildly dilated. 5. The mitral valve  is normal in structure. No evidence of mitral valve regurgitation. No evidence of mitral stenosis. 6. The aortic valve is tricuspid. Aortic valve regurgitation is not visualized. Aortic valve sclerosis is present, with no evidence of aortic valve stenosis. 7. The inferior vena cava is normal in size with greater than 50% respiratory variability, suggesting right atrial pressure of 3 mmHg.  FINDINGS Left Ventricle: Left ventricular ejection fraction, by estimation, is 55 to 60%. The left ventricle has normal function. The left ventricle has no regional wall motion abnormalities. The left ventricular internal cavity size was normal in size. There is no left ventricular hypertrophy. Left ventricular diastolic parameters are indeterminate.  Right Ventricle: The right ventricular size is normal. No increase in right ventricular wall thickness. Right ventricular systolic function is normal. Tricuspid regurgitation signal is inadequate for assessing PA pressure. The tricuspid regurgitant velocity is 1.54 m/s, and with an assumed right atrial pressure of 3 mmHg, the estimated right ventricular systolic pressure is 12.5 mmHg.  Left Atrium: Left atrial size was mildly dilated.  Right Atrium: Right atrial size was mildly dilated.  Pericardium: There is no evidence of pericardial effusion.  Mitral Valve: The mitral valve is normal in structure. No evidence of mitral valve regurgitation. No evidence of mitral valve stenosis. MV peak gradient, 4.8 mmHg. The mean mitral valve gradient is 1.0 mmHg.  Tricuspid Valve: The tricuspid valve is normal in structure. Tricuspid valve regurgitation is trivial.  Aortic Valve: The aortic valve is tricuspid. Aortic valve regurgitation is not visualized. Aortic valve sclerosis is present, with no evidence of aortic valve stenosis. Aortic valve mean gradient measures 3.3 mmHg. Aortic valve peak gradient measures 5.1 mmHg. Aortic valve area, by VTI measures 2.23  cm.  Pulmonic Valve: The pulmonic valve was grossly normal. Pulmonic valve regurgitation is not visualized.  Aorta: The aortic root and ascending aorta are structurally normal, with no evidence of dilitation.  Venous: The inferior vena cava is normal in size with greater than 50% respiratory variability, suggesting right atrial pressure of 3 mmHg.  IAS/Shunts: The interatrial septum was not well visualized.   LEFT VENTRICLE PLAX 2D LVIDd:         5.40 cm LVIDs:         3.40 cm LV PW:         0.80 cm LV IVS:        0.90 cm LVOT diam:     2.00 cm LV SV:         52 LV SV Index:   22 LVOT Area:     3.14 cm   RIGHT VENTRICLE RV Basal diam:  3.95 cm RV Mid diam:    3.20 cm RV S prime:  9.68 cm/s TAPSE (M-mode): 2.1 cm  LEFT ATRIUM              Index        RIGHT ATRIUM           Index LA diam:        4.00 cm  1.69 cm/m   RA Area:     24.60 cm LA Vol (A2C):   101.0 ml 42.69 ml/m  RA Volume:   80.70 ml  34.11 ml/m LA Vol (A4C):   86.1 ml  36.40 ml/m LA Biplane Vol: 94.6 ml  39.99 ml/m AORTIC VALVE                    PULMONIC VALVE AV Area (Vmax):    2.29 cm     PV Vmax:       0.75 m/s AV Area (Vmean):   2.12 cm     PV Peak grad:  2.2 mmHg AV Area (VTI):     2.23 cm AV Vmax:           113.00 cm/s AV Vmean:          81.667 cm/s AV VTI:            0.234 m AV Peak Grad:      5.1 mmHg AV Mean Grad:      3.3 mmHg LVOT Vmax:         82.23 cm/s LVOT Vmean:        55.233 cm/s LVOT VTI:          0.166 m LVOT/AV VTI ratio: 0.71  AORTA Ao Root diam: 3.60 cm Ao Asc diam:  3.70 cm  MITRAL VALVE                TRICUSPID VALVE MV Area (PHT): 4.60 cm     TR Peak grad:   9.5 mmHg MV Area VTI:   1.83 cm     TR Vmax:        154.00 cm/s MV Peak grad:  4.8 mmHg MV Mean grad:  1.0 mmHg     SHUNTS MV Vmax:       1.09 m/s     Systemic VTI:  0.17 m MV Vmean:      36.5 cm/s    Systemic Diam: 2.00 cm MV Decel Time: 165 msec MV E velocity: 105.00 cm/s  Epifanio Lesches  MD Electronically signed by Epifanio Lesches MD Signature Date/Time: 01/20/2022/4:45:49 PM    Final             Risk Assessment/Calculations:    CHA2DS2-VASc Score = 2   This indicates a 2.2% annual risk of stroke. The patient's score is based upon: CHF History: 0 HTN History: 1 Diabetes History: 0 Stroke History: 0 Vascular Disease History: 0 Age Score: 1 Gender Score: 0            Physical Exam:   VS:  BP 126/84 (BP Location: Left Arm, Patient Position: Sitting, Cuff Size: Large)   Pulse 74   Ht 6\' 2"  (1.88 m)   Wt 264 lb 12.8 oz (120.1 kg)   SpO2 92%   BMI 34.00 kg/m    Wt Readings from Last 3 Encounters:  01/03/23 264 lb 12.8 oz (120.1 kg)  11/07/22 266 lb (120.7 kg)  10/10/22 266 lb (120.7 kg)    GEN: Well nourished, well developed in no acute distress NECK: No JVD; No carotid bruits CARDIAC: RRR, no murmurs, rubs, gallops RESPIRATORY:  Clear to auscultation without rales, wheezing or rhonchi  ABDOMEN: Soft, non-tender, non-distended EXTREMITIES:  No edema; No deformity   ASSESSMENT AND PLAN: .    Persistent atrial fibrillation: On Eliquis and diltiazem.  He had solitary episode of atrial fibrillation in August 2023 in the setting of severe diarrhea secondary to E. coli and rotavirus infection.  He underwent cardioversion.  He has not had any recurrence since.  He wears a Scientist, physiological and regularly monitors his heart rate.  I discussed his case with Dr. Royann Shivers, given lack of recurrence and a solitary episode, he may stop anticoagulation therapy unless A-fib recurs.  Hypertension: Blood pressure stable.       Dispo: Establish with cardiologist once moved to [place].  Signed, Azalee Course, PA

## 2023-01-03 NOTE — Patient Instructions (Signed)
Medication Instructions:  The current medical regimen is effective;  continue present plan and medications as directed. Please refer to the Current Medication list given to you today.  *If you need a refill on your cardiac medications before your next appointment, please call your pharmacy*  Lab Work: NONE ordered at this time of appointment   If you have labs (blood work) drawn today and your tests are completely normal, you will receive your results only by: MyChart Message (if you have MyChart) OR A paper copy in the mail If you have any lab test that is abnormal or we need to change your treatment, we will call you to review the results.  Testing/Procedures: NONE ordered at this time of appointment    Follow-Up: At [facility], you and your health needs are our priority.  As part of our continuing mission to provide you with exceptional heart care, we have created designated Provider Care Teams.  These Care Teams include your primary Cardiologist (physician) and Advanced Practice Providers (APPs -  Physician Assistants and Nurse Practitioners) who all work together to provide you with the care you need, when you need it.  Your next appointment:   NO FOLLOW UP NEEDED   Provider:   Thurmon Fair, MD     Other Instructions PLEASE CALL us IF YOU NEED ANYTHING FURTHER

## 2023-01-26 ENCOUNTER — Other Ambulatory Visit: Payer: Self-pay | Admitting: Nurse Practitioner

## 2023-02-01 ENCOUNTER — Ambulatory Visit: Payer: 59 | Admitting: Cardiology

## 2023-02-15 ENCOUNTER — Other Ambulatory Visit: Payer: Self-pay | Admitting: Nurse Practitioner

## 2023-02-15 DIAGNOSIS — I4819 Other persistent atrial fibrillation: Secondary | ICD-10-CM

## 2023-02-15 NOTE — Telephone Encounter (Signed)
Eliquis 5mg  refill request received. Patient is 66 years old, weight-120.1kg, Crea-1.23 on 08/07/22 via [employer] tab, Diagnosis-Afib, and last seen by Azalee Course on 01/03/23. Dose is appropriate based on dosing criteria. Will send in refill to requested pharmacy.

## 2023-02-27 ENCOUNTER — Encounter: Payer: Self-pay | Admitting: Cardiovascular Disease

## 2023-02-28 NOTE — Telephone Encounter (Signed)
I am sorry to hear he has A-fib again, but that is not a surprise.  A-fib always tries to come back.  The heart rate is reasonably well-controlled.  Generally okay if heart rate less than 90 on the average at rest and less than 120 on the average with light exercise.  Unfortunately I do not know any cardiologist in [place].  In addition he did not tell us the exact town so hard for me to look up on the web.  (But that is all likely due to is actually look on the web and find somebody that seems to match his needs, he can probably do the same)

## 2023-02-28 NOTE — Telephone Encounter (Signed)
Verner Mould, MD Sorry-I saw now that he is in [place].  I found this physician's name as an electrophysiologist in town.  That means he specializes in arrhythmia and might be the best choice.  I cannot give any direct references about him or any of the other cardiologist in [place].
